# Patient Record
Sex: Female | Born: 1935 | Race: White | Hispanic: No
Health system: Southern US, Community
[De-identification: ages and names within clinical notes are randomized; demographics above are authoritative.]

---

## 2004-08-24 ENCOUNTER — Ambulatory Visit: Payer: Self-pay | Admitting: Family Medicine

## 2004-09-05 ENCOUNTER — Ambulatory Visit: Payer: Self-pay | Admitting: Family Medicine

## 2006-09-23 ENCOUNTER — Ambulatory Visit: Payer: Self-pay | Admitting: Family Medicine

## 2007-01-07 ENCOUNTER — Ambulatory Visit: Payer: Self-pay | Admitting: Family Medicine

## 2007-06-05 ENCOUNTER — Ambulatory Visit: Payer: Self-pay | Admitting: Family Medicine

## 2007-08-19 ENCOUNTER — Ambulatory Visit: Payer: Self-pay | Admitting: Family Medicine

## 2009-03-09 ENCOUNTER — Ambulatory Visit: Payer: Self-pay | Admitting: Family Medicine

## 2009-03-11 ENCOUNTER — Emergency Department: Payer: Self-pay | Admitting: Emergency Medicine

## 2009-03-21 ENCOUNTER — Emergency Department: Payer: Self-pay | Admitting: Emergency Medicine

## 2009-03-25 ENCOUNTER — Emergency Department: Payer: Self-pay | Admitting: Emergency Medicine

## 2009-04-06 ENCOUNTER — Ambulatory Visit: Payer: Self-pay | Admitting: Orthopedic Surgery

## 2010-02-13 ENCOUNTER — Ambulatory Visit: Payer: Self-pay | Admitting: Family Medicine

## 2010-05-14 ENCOUNTER — Emergency Department: Payer: Self-pay | Admitting: Emergency Medicine

## 2010-05-22 ENCOUNTER — Emergency Department: Payer: Self-pay | Admitting: Internal Medicine

## 2010-07-06 ENCOUNTER — Emergency Department: Payer: Self-pay | Admitting: Emergency Medicine

## 2010-09-16 ENCOUNTER — Emergency Department: Payer: Self-pay | Admitting: Emergency Medicine

## 2012-03-21 ENCOUNTER — Emergency Department: Payer: Self-pay | Admitting: Emergency Medicine

## 2012-03-21 LAB — COMPREHENSIVE METABOLIC PANEL
Albumin: 3.4 g/dL (ref 3.4–5.0)
Alkaline Phosphatase: 114 U/L (ref 50–136)
Anion Gap: 6 — ABNORMAL LOW (ref 7–16)
BUN: 34 mg/dL — ABNORMAL HIGH (ref 7–18)
Bilirubin,Total: 1.1 mg/dL — ABNORMAL HIGH (ref 0.2–1.0)
Calcium, Total: 8.8 mg/dL (ref 8.5–10.1)
Chloride: 101 mmol/L (ref 98–107)
Creatinine: 1.51 mg/dL — ABNORMAL HIGH (ref 0.60–1.30)
EGFR (African American): 36 — ABNORMAL LOW
Glucose: 157 mg/dL — ABNORMAL HIGH (ref 65–99)
Osmolality: 283 (ref 275–301)
Potassium: 3.5 mmol/L (ref 3.5–5.1)
Sodium: 136 mmol/L (ref 136–145)
Total Protein: 7 g/dL (ref 6.4–8.2)

## 2012-03-21 LAB — CBC
HCT: 37.3 % (ref 35.0–47.0)
HGB: 12.6 g/dL (ref 12.0–16.0)
MCH: 27.2 pg (ref 26.0–34.0)
MCV: 81 fL (ref 80–100)
Platelet: 174 10*3/uL (ref 150–440)
RBC: 4.63 10*6/uL (ref 3.80–5.20)
WBC: 12.8 10*3/uL — ABNORMAL HIGH (ref 3.6–11.0)

## 2012-03-24 ENCOUNTER — Inpatient Hospital Stay: Payer: Self-pay | Admitting: Internal Medicine

## 2012-03-24 LAB — URINALYSIS, COMPLETE
Blood: NEGATIVE
Granular Cast: 1
Nitrite: NEGATIVE
Ph: 5 (ref 4.5–8.0)
Protein: 30
Specific Gravity: 1.018 (ref 1.003–1.030)

## 2012-03-24 LAB — COMPREHENSIVE METABOLIC PANEL
Albumin: 3.5 g/dL (ref 3.4–5.0)
Alkaline Phosphatase: 143 U/L — ABNORMAL HIGH (ref 50–136)
Anion Gap: 5 — ABNORMAL LOW (ref 7–16)
BUN: 33 mg/dL — ABNORMAL HIGH (ref 7–18)
Bilirubin,Total: 1.3 mg/dL — ABNORMAL HIGH (ref 0.2–1.0)
Calcium, Total: 9.4 mg/dL (ref 8.5–10.1)
Chloride: 97 mmol/L — ABNORMAL LOW (ref 98–107)
Co2: 32 mmol/L (ref 21–32)
Creatinine: 1.67 mg/dL — ABNORMAL HIGH (ref 0.60–1.30)
Potassium: 3.4 mmol/L — ABNORMAL LOW (ref 3.5–5.1)
SGOT(AST): 37 U/L (ref 15–37)
SGPT (ALT): 35 U/L (ref 12–78)
Sodium: 134 mmol/L — ABNORMAL LOW (ref 136–145)
Total Protein: 7.7 g/dL (ref 6.4–8.2)

## 2012-03-24 LAB — CBC
HGB: 12.2 g/dL (ref 12.0–16.0)
MCHC: 33.4 g/dL (ref 32.0–36.0)
Platelet: 217 10*3/uL (ref 150–440)
RDW: 15.3 % — ABNORMAL HIGH (ref 11.5–14.5)
WBC: 13.1 10*3/uL — ABNORMAL HIGH (ref 3.6–11.0)

## 2012-03-24 LAB — PROTIME-INR
INR: 1.1
Prothrombin Time: 14.2 secs (ref 11.5–14.7)

## 2012-03-24 LAB — TSH: Thyroid Stimulating Horm: 1.69 u[IU]/mL

## 2012-03-24 LAB — MAGNESIUM: Magnesium: 1.8 mg/dL

## 2012-03-24 LAB — TROPONIN I: Troponin-I: <0.02 ng/mL

## 2012-03-25 LAB — BASIC METABOLIC PANEL
Anion Gap: 10 (ref 7–16)
BUN: 23 mg/dL — ABNORMAL HIGH (ref 7–18)
Chloride: 101 mmol/L (ref 98–107)
Creatinine: 1.48 mg/dL — ABNORMAL HIGH (ref 0.60–1.30)
EGFR (African American): 37 — ABNORMAL LOW
Glucose: 98 mg/dL (ref 65–99)
Osmolality: 276 (ref 275–301)
Sodium: 136 mmol/L (ref 136–145)

## 2012-03-25 LAB — CBC WITH DIFFERENTIAL/PLATELET
Basophil #: 0 10*3/uL (ref 0.0–0.1)
Basophil %: 0.2 %
Eosinophil %: 0.7 %
HCT: 31.9 % — ABNORMAL LOW (ref 35.0–47.0)
Lymphocyte #: 0.5 10*3/uL — ABNORMAL LOW (ref 1.0–3.6)
Lymphocyte %: 4.6 %
MCV: 80 fL (ref 80–100)
Monocyte %: 8.7 %
Neutrophil #: 9.6 10*3/uL — ABNORMAL HIGH (ref 1.4–6.5)
Platelet: 186 10*3/uL (ref 150–440)
RDW: 15.4 % — ABNORMAL HIGH (ref 11.5–14.5)
WBC: 11.2 10*3/uL — ABNORMAL HIGH (ref 3.6–11.0)

## 2012-03-26 LAB — BASIC METABOLIC PANEL
BUN: 16 mg/dL (ref 7–18)
Calcium, Total: 8.6 mg/dL (ref 8.5–10.1)
Chloride: 104 mmol/L (ref 98–107)
Creatinine: 1.32 mg/dL — ABNORMAL HIGH (ref 0.60–1.30)
EGFR (African American): 43 — ABNORMAL LOW
EGFR (Non-African Amer.): 37 — ABNORMAL LOW
Glucose: 92 mg/dL (ref 65–99)
Sodium: 138 mmol/L (ref 136–145)

## 2012-03-26 LAB — CBC WITH DIFFERENTIAL/PLATELET
Basophil %: 0.2 %
Eosinophil #: 0.1 10*3/uL (ref 0.0–0.7)
HCT: 33.2 % — ABNORMAL LOW (ref 35.0–47.0)
HGB: 11.2 g/dL — ABNORMAL LOW (ref 12.0–16.0)
Lymphocyte %: 8.6 %
MCHC: 33.6 g/dL (ref 32.0–36.0)
Monocyte %: 9.6 %
Neutrophil #: 6.5 10*3/uL (ref 1.4–6.5)
Neutrophil %: 79.9 %
RBC: 4.14 10*6/uL (ref 3.80–5.20)
WBC: 8.2 10*3/uL (ref 3.6–11.0)

## 2012-03-26 LAB — URINE CULTURE

## 2012-03-27 LAB — BASIC METABOLIC PANEL
BUN: 11 mg/dL (ref 7–18)
Co2: 20 mmol/L — ABNORMAL LOW (ref 21–32)
Creatinine: 1.2 mg/dL (ref 0.60–1.30)
EGFR (African American): 48 — ABNORMAL LOW
EGFR (Non-African Amer.): 41 — ABNORMAL LOW
Glucose: 88 mg/dL (ref 65–99)
Potassium: 3.9 mmol/L (ref 3.5–5.1)
Sodium: 140 mmol/L (ref 136–145)

## 2012-03-29 LAB — CULTURE, BLOOD (SINGLE)

## 2013-04-13 ENCOUNTER — Emergency Department: Payer: Self-pay | Admitting: Emergency Medicine

## 2013-04-13 LAB — URINALYSIS, COMPLETE
Bacteria: NONE SEEN
Blood: NEGATIVE
Leukocyte Esterase: NEGATIVE
Ph: 6 (ref 4.5–8.0)
Protein: 30
RBC,UR: 3 /HPF (ref 0–5)
Squamous Epithelial: NONE SEEN
WBC UR: 1 /HPF (ref 0–5)

## 2013-04-13 LAB — COMPREHENSIVE METABOLIC PANEL
Anion Gap: 7 (ref 7–16)
BUN: 16 mg/dL (ref 7–18)
Bilirubin,Total: 1.2 mg/dL — ABNORMAL HIGH (ref 0.2–1.0)
Calcium, Total: 9.5 mg/dL (ref 8.5–10.1)
Chloride: 94 mmol/L — ABNORMAL LOW (ref 98–107)
Co2: 31 mmol/L (ref 21–32)
Glucose: 138 mg/dL — ABNORMAL HIGH (ref 65–99)
Osmolality: 268 (ref 275–301)
SGPT (ALT): 13 U/L (ref 12–78)
Sodium: 132 mmol/L — ABNORMAL LOW (ref 136–145)
Total Protein: 7 g/dL (ref 6.4–8.2)

## 2013-04-13 LAB — CBC
HCT: 40 % (ref 35.0–47.0)
HGB: 14 g/dL (ref 12.0–16.0)
Platelet: 218 10*3/uL (ref 150–440)
RBC: 5.04 10*6/uL (ref 3.80–5.20)
WBC: 11.6 10*3/uL — ABNORMAL HIGH (ref 3.6–11.0)

## 2013-04-13 LAB — TROPONIN I: Troponin-I: <0.02 ng/mL

## 2013-04-13 LAB — LIPASE, BLOOD: Lipase: 83 U/L (ref 73–393)

## 2013-04-14 ENCOUNTER — Inpatient Hospital Stay: Payer: Self-pay | Admitting: Student

## 2013-04-14 LAB — CBC WITH DIFFERENTIAL/PLATELET
Basophil #: 0.1 10*3/uL (ref 0.0–0.1)
Eosinophil %: 0.1 %
HGB: 13.3 g/dL (ref 12.0–16.0)
Lymphocyte #: 0.8 10*3/uL — ABNORMAL LOW (ref 1.0–3.6)
Neutrophil #: 19.8 10*3/uL — ABNORMAL HIGH (ref 1.4–6.5)
Platelet: 219 10*3/uL (ref 150–440)
WBC: 21.7 10*3/uL — ABNORMAL HIGH (ref 3.6–11.0)

## 2013-04-14 LAB — URINALYSIS, COMPLETE
Bacteria: NONE SEEN
Bilirubin,UR: NEGATIVE
Blood: NEGATIVE
Leukocyte Esterase: NEGATIVE
Nitrite: NEGATIVE
RBC,UR: 1 /HPF (ref 0–5)
Specific Gravity: 1.021 (ref 1.003–1.030)
Squamous Epithelial: NONE SEEN
WBC UR: <1 /HPF (ref 0–5)

## 2013-04-14 LAB — COMPREHENSIVE METABOLIC PANEL
Albumin: 3.2 g/dL — ABNORMAL LOW (ref 3.4–5.0)
Bilirubin,Total: 1.2 mg/dL — ABNORMAL HIGH (ref 0.2–1.0)
Calcium, Total: 8.8 mg/dL (ref 8.5–10.1)
Chloride: 98 mmol/L (ref 98–107)
Creatinine: 1.31 mg/dL — ABNORMAL HIGH (ref 0.60–1.30)
EGFR (African American): 43 — ABNORMAL LOW
EGFR (Non-African Amer.): 37 — ABNORMAL LOW
Glucose: 132 mg/dL — ABNORMAL HIGH (ref 65–99)
Osmolality: 278 (ref 275–301)
Potassium: 3.3 mmol/L — ABNORMAL LOW (ref 3.5–5.1)
SGOT(AST): 19 U/L (ref 15–37)
Sodium: 134 mmol/L — ABNORMAL LOW (ref 136–145)

## 2013-04-14 LAB — MAGNESIUM: Magnesium: 1.3 mg/dL — ABNORMAL LOW

## 2013-04-15 LAB — CBC WITH DIFFERENTIAL/PLATELET
Basophil #: 0 10*3/uL (ref 0.0–0.1)
Eosinophil #: 0 10*3/uL (ref 0.0–0.7)
Eosinophil %: 0.1 %
HGB: 11.7 g/dL — ABNORMAL LOW (ref 12.0–16.0)
MCV: 80 fL (ref 80–100)
Monocyte #: 1.1 x10 3/mm — ABNORMAL HIGH (ref 0.2–0.9)
Monocyte %: 9.1 %
Neutrophil %: 84.5 %
Platelet: 198 10*3/uL (ref 150–440)
RBC: 4.24 10*6/uL (ref 3.80–5.20)
RDW: 14.8 % — ABNORMAL HIGH (ref 11.5–14.5)
WBC: 11.9 10*3/uL — ABNORMAL HIGH (ref 3.6–11.0)

## 2013-04-16 LAB — CBC WITH DIFFERENTIAL/PLATELET
Basophil #: 0 10*3/uL (ref 0.0–0.1)
Basophil %: 0.3 %
Eosinophil %: 0.8 %
HCT: 33 % — ABNORMAL LOW (ref 35.0–47.0)
Lymphocyte #: 0.7 10*3/uL — ABNORMAL LOW (ref 1.0–3.6)
MCH: 28 pg (ref 26.0–34.0)
MCV: 80 fL (ref 80–100)
Neutrophil %: 77.6 %
Platelet: 201 10*3/uL (ref 150–440)
RDW: 14.3 % (ref 11.5–14.5)
WBC: 8.4 10*3/uL (ref 3.6–11.0)

## 2013-04-16 LAB — BASIC METABOLIC PANEL
Anion Gap: 7 (ref 7–16)
BUN: 31 mg/dL — ABNORMAL HIGH (ref 7–18)
Calcium, Total: 8.4 mg/dL — ABNORMAL LOW (ref 8.5–10.1)
Chloride: 102 mmol/L (ref 98–107)
EGFR (African American): 52 — ABNORMAL LOW
EGFR (Non-African Amer.): 45 — ABNORMAL LOW
Potassium: 3 mmol/L — ABNORMAL LOW (ref 3.5–5.1)
Sodium: 138 mmol/L (ref 136–145)

## 2013-04-17 LAB — BASIC METABOLIC PANEL
Anion Gap: 6 — ABNORMAL LOW (ref 7–16)
BUN: 26 mg/dL — ABNORMAL HIGH (ref 7–18)
Calcium, Total: 8.6 mg/dL (ref 8.5–10.1)
Creatinine: 1.02 mg/dL (ref 0.60–1.30)
EGFR (Non-African Amer.): 50 — ABNORMAL LOW
Glucose: 106 mg/dL — ABNORMAL HIGH (ref 65–99)
Osmolality: 283 (ref 275–301)
Potassium: 3.4 mmol/L — ABNORMAL LOW (ref 3.5–5.1)

## 2013-04-19 LAB — CULTURE, BLOOD (SINGLE)

## 2013-05-01 ENCOUNTER — Inpatient Hospital Stay: Payer: Self-pay | Admitting: Specialist

## 2013-05-01 LAB — COMPREHENSIVE METABOLIC PANEL
Albumin: 3.8 g/dL (ref 3.4–5.0)
Anion Gap: 5 — ABNORMAL LOW (ref 7–16)
BUN: 77 mg/dL — ABNORMAL HIGH (ref 7–18)
Bilirubin,Total: 1 mg/dL (ref 0.2–1.0)
Chloride: 106 mmol/L (ref 98–107)
Co2: 29 mmol/L (ref 21–32)
Creatinine: 2.54 mg/dL — ABNORMAL HIGH (ref 0.60–1.30)
EGFR (African American): 19 — ABNORMAL LOW
Glucose: 115 mg/dL — ABNORMAL HIGH (ref 65–99)
Potassium: 4.2 mmol/L (ref 3.5–5.1)
SGOT(AST): 18 U/L (ref 15–37)
SGPT (ALT): 19 U/L (ref 12–78)
Total Protein: 7.7 g/dL (ref 6.4–8.2)

## 2013-05-01 LAB — URINALYSIS, COMPLETE
Bacteria: NONE SEEN
Bilirubin,UR: NEGATIVE
Blood: NEGATIVE
Glucose,UR: NEGATIVE mg/dL (ref 0–75)
Hyaline Cast: 3
Ph: 8 (ref 4.5–8.0)
RBC,UR: 1 /HPF (ref 0–5)
WBC UR: 1 /HPF (ref 0–5)

## 2013-05-01 LAB — CK TOTAL AND CKMB (NOT AT ARMC)
CK, Total: 33 U/L (ref 21–215)
CK-MB: <0.5 ng/mL — ABNORMAL LOW (ref 0.5–3.6)

## 2013-05-01 LAB — CBC
HCT: 39.3 % (ref 35.0–47.0)
MCHC: 33.5 g/dL (ref 32.0–36.0)
MCV: 82 fL (ref 80–100)
Platelet: 309 10*3/uL (ref 150–440)
RBC: 4.8 10*6/uL (ref 3.80–5.20)
RDW: 14.7 % — ABNORMAL HIGH (ref 11.5–14.5)
WBC: 10 10*3/uL (ref 3.6–11.0)

## 2013-05-02 LAB — CBC WITH DIFFERENTIAL/PLATELET
Basophil #: 0.1 10*3/uL (ref 0.0–0.1)
Basophil %: 1 %
Eosinophil #: 0.3 10*3/uL (ref 0.0–0.7)
Eosinophil %: 4.9 %
HCT: 33.5 % — ABNORMAL LOW (ref 35.0–47.0)
HGB: 11.4 g/dL — ABNORMAL LOW (ref 12.0–16.0)
Lymphocyte %: 16.7 %
MCH: 27.5 pg (ref 26.0–34.0)
MCHC: 34.1 g/dL (ref 32.0–36.0)
Monocyte #: 0.6 x10 3/mm (ref 0.2–0.9)
Monocyte %: 9.3 %
Neutrophil #: 4.4 10*3/uL (ref 1.4–6.5)
Neutrophil %: 68.1 %
RBC: 4.16 10*6/uL (ref 3.80–5.20)
RDW: 14.3 % (ref 11.5–14.5)

## 2013-05-02 LAB — BASIC METABOLIC PANEL
Anion Gap: 6 — ABNORMAL LOW (ref 7–16)
BUN: 58 mg/dL — ABNORMAL HIGH (ref 7–18)
Chloride: 112 mmol/L — ABNORMAL HIGH (ref 98–107)
Co2: 27 mmol/L (ref 21–32)
Glucose: 94 mg/dL (ref 65–99)
Osmolality: 305 (ref 275–301)
Potassium: 3.3 mmol/L — ABNORMAL LOW (ref 3.5–5.1)
Sodium: 145 mmol/L (ref 136–145)

## 2013-05-03 LAB — BASIC METABOLIC PANEL
BUN: 39 mg/dL — ABNORMAL HIGH (ref 7–18)
Calcium, Total: 8.8 mg/dL (ref 8.5–10.1)
Chloride: 112 mmol/L — ABNORMAL HIGH (ref 98–107)
Co2: 27 mmol/L (ref 21–32)
Creatinine: 1.68 mg/dL — ABNORMAL HIGH (ref 0.60–1.30)
EGFR (African American): 32 — ABNORMAL LOW
EGFR (Non-African Amer.): 27 — ABNORMAL LOW
Osmolality: 296 (ref 275–301)
Potassium: 3.1 mmol/L — ABNORMAL LOW (ref 3.5–5.1)

## 2013-05-04 LAB — BASIC METABOLIC PANEL
BUN: 28 mg/dL — ABNORMAL HIGH (ref 7–18)
Chloride: 114 mmol/L — ABNORMAL HIGH (ref 98–107)
Co2: 24 mmol/L (ref 21–32)
Potassium: 3.6 mmol/L (ref 3.5–5.1)

## 2013-05-13 ENCOUNTER — Ambulatory Visit: Payer: Self-pay | Admitting: Internal Medicine

## 2013-05-13 ENCOUNTER — Emergency Department: Payer: Self-pay | Admitting: Internal Medicine

## 2013-05-14 ENCOUNTER — Inpatient Hospital Stay: Payer: Self-pay | Admitting: Internal Medicine

## 2013-05-14 LAB — PRO B NATRIURETIC PEPTIDE: B-Type Natriuretic Peptide: 4837 pg/mL — ABNORMAL HIGH (ref 0–450)

## 2013-05-14 LAB — COMPREHENSIVE METABOLIC PANEL
Alkaline Phosphatase: 107 U/L (ref 50–136)
Anion Gap: 16 (ref 7–16)
Chloride: 104 mmol/L (ref 98–107)
Glucose: 152 mg/dL — ABNORMAL HIGH (ref 65–99)
Osmolality: 289 (ref 275–301)
Potassium: 3.1 mmol/L — ABNORMAL LOW (ref 3.5–5.1)
SGOT(AST): 21 U/L (ref 15–37)
Total Protein: 7.2 g/dL (ref 6.4–8.2)

## 2013-05-14 LAB — CBC
HCT: 41 % (ref 35.0–47.0)
MCHC: 34 g/dL (ref 32.0–36.0)
MCV: 82 fL (ref 80–100)

## 2013-05-14 LAB — CK TOTAL AND CKMB (NOT AT ARMC): CK, Total: 44 U/L (ref 21–215)

## 2013-05-19 LAB — CULTURE, BLOOD (SINGLE)

## 2013-06-06 ENCOUNTER — Ambulatory Visit: Payer: Self-pay | Admitting: Internal Medicine

## 2013-06-06 DEATH — deceased

## 2014-11-23 NOTE — H&P (Signed)
PATIENT NAME:  Haley Miles, Haley Miles MR#:  [id number] DATE OF BIRTH:  March 03, 1926  DATE OF ADMISSION:  03/24/2012  PRIMARY CARE PHYSICIAN: Ulanda Edison, MD  ER REFERRING PHYSICIAN: Si Raider, MD  CHIEF COMPLAINT: Cough, disorientation, and weakness.   HISTORY OF PRESENT ILLNESS: History is obtained from the ER physician, nursing staff, and old records reviewed and chest x-ray image reviewed along with EKG being reviewed personally.   This is an 81 year old female patient who is a resident of an assisted living facility with vascular dementia, hypertension, and hypothyroidism who presented to the emergency room sent in after she has been feeling extremely weak over the past few days and has not been eating at all and has disorientation. The patient at baseline has dementia, but has been more sleepy and confused at the assisted-living facility and has been brought to the emergency room. Here her daughter is not available at this time for history. I called the daughter and left a voice mail at [phone], and her name is Rocco Pauls.  The patient does not have any concerns and mentions that she wants to rest and sleep, does not complain of any pain, nausea, vomiting, rash, or fever. She has been coughing in the emergency room, which has been nonproductive. A chest x-ray shows possible bilateral basal infiltrates versus atelectasis, T-max is 100.2 with elevated white count, and the patient is being admitted to the hospitalist service for acute encephalopathy with evidence of healthcare-acquired pneumonia and dehydration with acute renal failure over chronic kidney disease.   PAST MEDICAL HISTORY:  1. Hypothyroidism. 2. Hyperlipidemia. 3. Hypertension. 4. Vascular dementia. 5. Osteoporosis.    PAST SURGICAL HISTORY:  1. Thyroidectomy. 2. Partial hysterectomy. 3. Carpal tunnel release.   FAMILY HISTORY: Reviewed and unknown.   ALLERGIES: No known drug allergies.   SOCIAL HISTORY: The  patient does not smoke. No alcohol and no illicit drugs. She is a resident of an assisted living facility. The patient's code status is unknown at this time. She tends to ambulate on her own but does need assistance at times. She is on a regular diet at the assisted living facility.   REVIEW OF SYSTEMS: Not obtainable as the patient is confused and lethargic.   HOME MEDICATIONS:  1. Donepezil 10 mg oral once a day.  2. Hydrochlorothiazide/triamterene 25/37.5 mg 1 tablet oral once a day.  3. Levothyroxine 75 mcg oral once a day.  4. Meloxicam 7.5 mg oral once a day.  5. Remeron 15 mg 1/2 tablet oral once a day at bedtime.  6. Nystatin 100,000 units topically to the affected area twice a day.  7. MiraLax once a day as needed for constipation.  8. Ranitidine 150 mg oral two times a day.  9. Simvastatin 20 mg oral once a day.  10. Tylenol 325 mg 2 tablets orally every four hours as needed for fever or pain.   PHYSICAL EXAMINATION:   VITAL SIGNS: Temperature 100.2, pulse 78, respirations 16, blood pressure 134/79, breathing 18 per minute, and saturating 94% on room air.   GENERAL: Frail, elderly Caucasian female patient lying in bed, lethargic and drowsy.   PSYCHIATRIC: She wakes up on calling her name. Has a flat affect, poor judgment, and is not oriented to person, place, or time.   HEENT: Atraumatic, normocephalic. Oral mucosa dry and pink. External ears and nose normal. No pallor. No icterus. Pupils bilaterally equal and reactive to light.   NECK: Supple. No thyromegaly. No palpable lymph nodes. Trachea midline. No carotid  bruit or JVD. Has scars from prior thyroid surgery.   CARDIOVASCULAR: S1 and S2 regular rate and rhythm without any murmurs. Peripheral pulses 2+. No edema.   RESPIRATORY: Has bilateral basal crackles with good air entry. Not using any accessory muscles. Normal to percussion.   GASTROINTESTINAL: Soft abdomen, nontender. Bowel sounds present. No hepatosplenomegaly  palpable.   SKIN: Warm and dry. No petechiae, rash, or ulcers.   MUSCULOSKELETAL: Has crepitus in her knees. No joint swelling, redness, or effusion of the large joints. Normal muscle tone.   NEURO: Generalized decreased motor strength of 4/5 in all her extremities. Sensation to fine touch intact all over.  LABORATORY, DIAGNOSTIC AND RADIOLOGIC DATA: Glucose 103, BUN 33, creatinine 1.67, sodium 134, potassium 3.4, chloride 97, GFR 28, and AST and ALT are normal. Troponin less than 0.02. WBC 13.1 with hemoglobin 12.2 and platelets of 217.   Urinalysis shows no bacteria, 1 WBC.   EKG shows normal sinus rhythm with nonspecific ST-T wave changes.   Chest x-ray shows good aeration. No cardiomegaly. Has some mild basilar atelectasis versus infiltrate, more so on the right side.   ASSESSMENT AND PLAN:  1. Acute encephalopathy likely secondary from pneumonia. The patient will be admitted onto the medical floor and treated for pneumonia along with fall precautions.  2. Healthcare-acquired pneumonia. The patient does seem to have some basal infiltrates, more so on the right side. Her T-max was 100.2 with elevated white count. The patient does not have any acute respiratory failure. We will start her on vancomycin, Zosyn, and levofloxacin at this time along with blood cultures and sputum cultures. If her cultures are negative, the patient's antibiotic choice can be narrowed quickly. We will repeat chest x-ray in the morning, PA and lateral, for follow-up.  3. Acute renal failure/chronic kidney disease. The patient does have worsening creatinine at 1.6 and GFR less than 30. She does seem to have baseline chronic kidney, disease stage III. We will start her on IV fluids and follow.  4. Hypokalemia likely secondary from decreased intake. We will replace as needed.  5. Hypothyroidism. We will check a TSH as this could be part of her encephalopathy.  6. General debility and unable to walk. The patient has  generalized weakness secondary to the pneumonia and dehydration. We will consult physical therapy.  7. CODE STATUS. Unknown at this time. We will try to contact the patient's daughter again for her code status. 8. Deep vein thrombosis prophylaxis with heparin.   TIME SPENT: Time spent today on this case was 50 minutes with more than 50% time spent in coordination of care. ____________________________ Molinda BailiffSrikar R. Deron Poole, MD srs:slb D: 03/24/2012 13:46:59 ET T: 03/24/2012 13:59:29 ET JOB#: [id number]  cc: Wardell HeathSrikar R. Merla Sawka, MD, <Dictator> Lucillie GarfinkelEmma R. Mayford KnifeWilliams, MD Orie FishermanSRIKAR R Remi Lopata MD ELECTRONICALLY SIGNED 03/24/2012 15:05

## 2014-11-23 NOTE — Discharge Summary (Signed)
PATIENT NAME:  Haley Miles, Haley Miles MR#:  [id number] DATE OF BIRTH:  12-20-1925  DATE OF ADMISSION:  03/24/2012 DATE OF DISCHARGE:  03/28/2012  ADMITTING DIAGNOSIS: Cough, confusion, and weakness.   DISCHARGE DIAGNOSES:  1. Cough and confusion due to pneumonia, likely community-acquired pneumonia. The patient lives in an assisted living facility.  2. Acute encephalopathy likely due to infection, acute renal failure, now close to baseline.  3. Baseline dementia.  4. Acute renal failure due to dehydration, resolved with IV hydration.  5. Hypothyroidism.  6. Hyperlipidemia.  7. Hypertension.  8. Vascular dementia.  9. Osteoporosis.  10. Status post thyroidectomy.  11. Status post partial hysterectomy.  12. Status post carpal tunnel release.   PERTINENT LABORATORY AND EVALUATIONS: Admitting glucose 103, BUN 33, creatinine 1.67, sodium 134, potassium 3.4, chloride 97. AST and ALT were normal. Troponin less than 0.02. WBC count was 13.1, hemoglobin 12.2. Urinalysis was negative for nitrites, leukocyte. EKG showed normal sinus rhythm with nonspecific ST-T wave changes. Chest x-ray showed density in the right lung base posteriorly consistent with pneumonia.   CONSULTANTS: Speech evaluation   HOSPITAL COURSE: The patient is an 81 year old white female who currently resides in an assisted living facility, has vascular dementia, hypertension, and hypothyroidism. She was sent to the ED after she was feeling extremely weak and was having a cough. The patient was admitted for possible pneumonia. She was started on broad-spectrum antibiotics, initially was noted to have leukocytosis. With treatment with antibiotics her WBC count normalized.  The patient's antibiotic coverage was narrowed . She also was evaluated for aspiration. There was no evidence of aspiration. On presentation she was noted to be in acute renal failure. She was given IV fluids and her creatinine normalized. At this time, the patient is doing  better, still not eating much, and has had intermittent confusion as a result of her dementia, but overall is doing better.   DISCHARGE MEDICATIONS:  1. Donepezil 10, 1 tab p.o. daily.  2. Levothyroxine 75 mcg daily.  3. Mirtazapine 15 mg, half tab at bedtime. Hold if the patient is sedated. 4. Nystatin p.r.n. 5. Ovide  0.5% topical lotion, apply topically to dry scalp and hair area once. 6. MiraLAX 17 grams in 4 to 8 ounces  water daily as needed.  7. Simvastatin 20 mg at bedtime.  8. Tylenol 650 mg every four hours p.r.n.  9. Ranitidine 1 tab p.o. b.i.d.  10. Levaquin 500, 1 tab p.o. q. 24 hours to continue for six days. 11. Fluticasone nasal spray, one spray to each nostril daily.   DIET: Regular diet, consistency mechanical soft with thin liquids, well cut meats, moisturize with gravy. Aspiration precautions. Make sure patient is sitting fully upright.   ACTIVITY: As tolerated with assistance.  FOLLOWUP: Follow up in 1 to 2 weeks with primary M.D., Dr. Ulanda EdisonEmma Williams.  TIME SPENT: 35 minutes.  ____________________________ Lacie ScottsShreyang H. Allena KatzPatel, MD shp:bjt D: 03/28/2012 13:40:13 ET T: 03/28/2012 14:11:33 ET JOB#: [id number]  cc: Granvil Djordjevic H. Allena KatzPatel, MD, <Dictator> Lucillie GarfinkelEmma R. Mayford KnifeWilliams, MD Charise CarwinSHREYANG H Milanni Ayub MD ELECTRONICALLY SIGNED 03/28/2012 16:04

## 2014-11-26 NOTE — H&P (Signed)
PATIENT NAME:  Haley Miles, HARTS MR#:  [id number] DATE OF BIRTH:  August 30, 1925  DATE OF ADMISSION:  05/14/2013  REFERRING ER PHYSICIAN:  Dr. Lowella Fairy.  PRIMARY CARE PHYSICIAN:  Dr. Ulanda Edison, at the [facility].   CHIEF COMPLAINT: Shortness of breath.   HISTORY OF PRESENT ILLNESS: The patient is an 81 year old female who had 2 recent admissions in the hospital in the last month, discharged 10 days ago. Came in for severe respiratory distress. As per the family members were present in the room the patient is a nursing home resident and she has terminal dementia. She walks minimally with a walker over there. Yesterday she had a fall and she was brought to the Emergency Room, but she was sent back from the ER.   Tuesday morning, the nursing home staff found her in severe respiratory distress and had vomitus in her mouth. Her blood pressure was also running on the lower side, so they sent her to the Emergency Room. She was found being hypotensive, tachypneic, and hypoxic and so started on non-rebreather mask oxygen supplementation and broad-spectrum antibiotics, though x-ray looked possibly aspiration because of repeated hospital admissions, and hospitalist service was contacted for further management of this issue.   REVIEW OF SYSTEMS: Unable to get because of patient's dementia.   Past medical history obtained from old medical records and history obtained from family members and old records available in the system.   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   PAST SURGICAL HISTORY: Ulcer surgery in the past, thyroid surgery, hysterectomy, bilateral carpal tunnel surgeries.   SOCIAL HISTORY: Lives at [facility]. Quit smoking 50 years ago. No alcohol. No drug use. Used to sew cushions in the past   FAMILY HISTORY: Father died of MI, unknown age. Mother died of childbirth complications.    HOME MEDICATIONS:  1.  Amlodipine 2.5 mg oral tablet once a day.  2.  Donepezil 10 mg oral tablet once a day.  3.  Fluticasone nasal 50 mcg spray once a day.  4.  Haldol 0.5 mg oral tablet, take one-half tablet 2 times a day as needed for agitation.  5.  Hydrochlorothiazide and triamterene, 25/37.5 mg oral capsule once a day.  6.  Levothyroxine 75 mcg once a day for thyroid activity.  7.  Megestrol 40 mg per mL oral suspension, 20 mL orally 2 times a day.  8.  Mirtazapine 15 mg oral tablet, take 1/2 tablet orally once a day.  9.  Nystatin 100,000 units per gram topical cream, apply to topically affected area 2 times a day.  10.  Simvastatin 20 mg oral tablet once a day at bedtime for cholesterol.  11. Tylenol 650 mg oral tablet every 8 hours as needed for pain.   VITALS VIRAL IN ER: Temperature 97.7, pulse rate ranging from 140 to 120, and respiratory rate ranging from 35 to 25. Blood pressure was 108/61 on arrival, but has gone down to 82/52, and after receiving some IV fluids now it is 88/55. Pulse ox is currently 96% to 97% on non-rebreather mask.  GENERAL: The patient is alert but appears in respiratory distress. She is cachectic and not very well oriented to time, place and person.  HEENT: Head and neck atraumatic. Oral mucosa dry.  NECK: Supple. No JVD.  RESPIRATORY: Bilateral equal air entry. Some crepitation present. No wheezing.  CARDIOVASCULAR: S1, S2 present, regular, tachycardia.  ABDOMEN: Soft, nontender. Bowel sounds present. No organomegaly.  SKIN: No rashes.  LEGS: No edema, but chronic skin changes present.  NEUROLOGICAL: Power 3/5. Moves limbs very weakly. No tremors or rigidity.   IMPORTANT LAB RESULTS: Glucose 152. BNP is 4837, BUN is 51, creatinine 2.61, which was 1.02 on 12th of September 2014.   Sodium 136, potassium 3.1, chloride 104 and CO2 16. Calcium is 10.   Troponin is 0.12.   White cell count is 21,000, hemoglobin 13.9 and platelet count 315.   ABG:  PH 7.40, pCO2 21 and pO2 is 87 on 100% FiO2 with non-rebreather mask.   Chest x-ray, portable, shows right lung base pneumonia; follow-up PA and lateral view.   ASSESSMENT AND PLAN: An 81 year old female with a past medical history of dementia, hypertension, hyperlipidemia, who is a nursing home resident, with repeated admissions to the hospital, sent today because of acute respiratory failure and found having vomitus in the mouth at the same time.   1.  Aspiration pneumonitis: As she had multiple admissions and she is currently hypotensive, will also cover with broad-spectrum antibiotics, but may taper fast if she starts recovering.  2.  Acute respiratory failure: She is currently on a non-rebreather mask. Family need to decide about intubation and ventilator issues as she might need it if she does not  improve enough. Currently oxygen saturations are well-maintained on non-rebreather mask.  3.  Septic shock as evidenced by hypotension, hypoxia, tachycardia, tachypnea, renal failure and evidence of pneumonia on chest x-ray: We will admit her to CCU with broad-spectrum antibiotics. Will give IV fluids for now which is able to bring up the blood pressure a little bit.   I also discussed with the family about the possible need of Lopressor and central line, and they need to discuss this issue amongst themselves and come to a final conclusion if the need arises.  4.  Aspiration: Will get a speech and swallow evaluation. Meanwhile will keep her n.p.o. 5.  Elevated BNP and troponin: Most likely this should be because of acute stress and infection, but we will get echocardiogram and will follow serial troponins to make sure it is not a cardiac event,  6.  Other medical issues: Dementia, hypertension, hyperlipidemia, hypothyroidism: Will hold all these medications at this time as we do not want to give anything orally and the patient is hypotensive.   Condition is critical due to respiratory failure and  septic shock, and malnutrition. Discussed with family members about her deciding code status,  and also called palliative care consult to help family members in this decision-making.   Prognosis is extremely poor due to repeated admissions, malnutrition, and sick condition this time, and she might benefit from comfort care.   Total critical care time spent: 60 minutes.    ____________________________ Hope PigeonVaibhavkumar G. Elisabeth PigeonVachhani, MD vgv:dm D: 05/14/2013 10:44:48 ET T:1 05/14/2013 11:09:38 ET JOB#: [id number]  cc: Hope PigeonVaibhavkumar G. Elisabeth PigeonVachhani, MD, <Dictator> Altamese DillingVAIBHAVKUMAR Rexanne Inocencio MD ELECTRONICALLY SIGNED 05/18/2013 23:03

## 2014-11-26 NOTE — Discharge Summary (Signed)
PATIENT NAME:  Haley ArrowCURTIS, Peg MR#:  [id number] DATE OF BIRTH:  08-13-1925  DATE OF ADMISSION:  04/14/2013 DATE OF DISCHARGE:  04/20/2013  PRIMARY CARE PHYSICIAN: Dr. Ulanda EdisonEmma Williams.   CHIEF COMPLAINTS: Vomiting; altered mental status.   DISCHARGE DIAGNOSES: 1.  Metabolic encephalopathy, secondary to pneumonia in the setting of dementia.  2.  Dehydration.  3.  Hypertension.  4.  Chronic kidney disease, stage III.  5.  Hypokalemia.  6.  History of hypothyroidism.  7.  History of depression.  8.  Hyperlipidemia.  9.  Osteoporosis.   DISCHARGE MEDICATIONS: Donepezil 10 mg daily, levothyroxine 75 mcg daily, mirtazapine 7.5 mg at bedtime, nystatin topical apply 2 times a day to affected areas, fluticasone mcg, 1 spray once a day, hydrochlorothiazide/triamterene 25/37.5 mg 1 tablet once a day, simvastatin 20 mg daily, haloperidol 0.5 mg tablet, one-half tablet 2 times a day as needed for agitation, Tylenol 650 every 8 hours as needed for pain, amlodipine 2.5 mg daily, cefprozil  500 mg 1 tablet every 12 hours for 2 days.   DISPOSITION: Back to [facility] with home health PT and RN.   DIET: Low-sodium.   ACTIVITY: As tolerated.   FOLLOWUP: Please follow with PCP within 1 to 2 weeks.   THE PATIENT IS A FULL CODE.   HISTORY OF PRESENT ILLNESS AND HOSPITAL COURSE: For full details of H and P, please see the dictation on September 9th by Dr. Elisabeth PigeonVachhani, but briefly this is an 81 year old female with a history of vascular dementia at baseline who came in for the above chief complaint. She was lethargic, had some vomiting. CT of the abdomen did not show any worsening from the previous CT which was done in the ER, however some pneumonia was seen at the bases and therefore she was admitted to the hospitalist service.   She was more confused than her base vomiting. This is likely metabolic encephalopathy  secondary to pneumonia. She was started on some gentle fluids and antibiotics with ceftriaxone,  azithromycin and nebs for treatment. The patient did have some cough hoarseness and a sore throat, likely secondary to a vomiting episode she had had recently, which has resolved. She did well and slowly improved with the antibiotics.   On arrival she did have a bump in BUN and creatinine above her baseline. The patient does appear to have CKD, stage III, and her BUN on arrival was 36 and creatinine of 1.31. By September 12th\, creatinine was 1.02.   The patient did have significant leukocytosis with a WBC of 21.7 on admission, which had resolved by September 11th, being 8.4. Blood cultures were negative.   Lactic acid on arrival was 1.4.   X-ray of the chest, PA and lateral, shows mild chronic enlargement of the cardiac silhouette, but no overt evidence of CHF or pneumonia, but the CT chest without contrast on done on September 9th had shown atelectasis versus infiltrate within the lung bases as well as trace effusion. Given that as well as the significant leukocytosis she likely did have pneumonia. She has improved significantly, and antibiotics were transitioned to p.o. cephalosporins and her appetite is better, but not at baseline. The patient was seen by physical therapy, and although the recommendation was for SNF the family and her previous place of residence thought that she could go back to the same place with home health and PT. She is a chronic resident of [facility] and she might be best-served there. On the day of discharge her temperature was 98.2,  pulse rate 85, respiratory rate 16, blood pressure 141/72. O2 sat 95%.   GENERAL: The patient is a frail, elderly Caucasian female laying in bed, arousable, not acutely distressed. The patient has normal respiratory effort. No use of accessory muscles and still has some mild crackles but improved on the right,  Regular rate and rhythm on auscultation of the heart sounds, and no significant lower extremity edema. She will be discharged with  outpatient follow with PCP.   Total time spent: Thirty-five minutes.     ____________________________ Krystal Eaton, MD sa:dm D: 04/21/2013 08:25:06 ET T: 04/21/2013 09:10:30 ET JOB#: [id number]  cc: Krystal Eaton, MD, <Dictator> Krystal Eaton MD ELECTRONICALLY SIGNED 04/28/2013 14:06

## 2014-11-26 NOTE — H&P (Signed)
PATIENT NAME:  Haley Miles, TARPLEY MR#:  [id number] DATE OF BIRTH:  September 29, 1925  DATE OF ADMISSION:  05/01/2013  PRIMARY CARE PHYSICIAN: Dr. Ulanda Edison at the [facility].   CHIEF COMPLAINT: Not feeling good.   HISTORY OF PRESENT ILLNESS: This is an 81 year old female who was recently in the hospital and discharged mid September and came in for metabolic encephalopathy secondary to pneumonia and also dehydration. As per the daughter, the patient has not been eating or drinking very well over the last 2 weeks. She is walking with a walker with the physical therapist at [facility] assisted [facility]. The patient does have dementia, so she is not the best historian. She does answer some yes or no questions but does not elaborate. In the ER, she was found to be in acute renal failure with a creatinine of 2.54. Her baseline creatinine was 1.02, which makes her chronic kidney disease stage III. Hospitalist services were contacted for further evaluation.   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   PAST SURGICAL HISTORY: Ulcer surgery in the past, thyroid surgery, hysterectomy, bilateral carpal tunnel.   ALLERGIES: No known drug allergies.   MEDICATIONS: As per Prescription Writer include amlodipine 2.5 mg (which was just stopped as per her primary care physician), donepezil 10 mg at bedtime, Flonase 50 mcg per spray 1 spray each nostril daily, Haldol 0.5 mg twice a day as needed for agitation, Dyazide 25/37.5 one tablet daily for blood pressure, levothyroxine 75 mcg daily, Remeron 15 mg 1/2 tablet at night, nystatin cream under breasts twice a day, simvastatin 20 mg daily, Tylenol extended-release 1 tablet every 8 hours as needed for pain.   SOCIAL HISTORY: Lives at [facility] assisted living. Quit smoking 50 years ago. No alcohol. No drug use. Used to sew cushions in the past.   FAMILY HISTORY: Father died of MI,  unknown age. Mother died of childbirth complications   REVIEW OF SYSTEMS:    CONSTITUTIONAL: No fever, chills or sweats. Positive for weight loss, cannot tell me how much. Positive for weakness.  EYES: She does wear glasses.  EARS, NOSE, MOUTH AND THROAT: No sore throat. No difficulty swallowing.  CARDIOVASCULAR: No chest pain. No palpitations.  RESPIRATORY: Occasional cough. No shortness of breath. No sputum. No hemoptysis.  GASTROINTESTINAL: No nausea. No vomiting. No abdominal pain. No diarrhea. No constipation. No bright red blood per rectum. No melena.  GENITOURINARY: No burning on urination. No hematuria.  MUSCULOSKELETAL: No joint pain or muscle pain.  INTEGUMENTARY: Skin rash under the breast.  NEUROLOGIC: No fainting or blackouts.  PSYCHIATRIC: On medication for dementia and agitation.  ENDOCRINE: Positive for hypothyroidism.  HEMATOLOGIC AND LYMPHATIC: No anemia.   PHYSICAL EXAMINATION: VITAL SIGNS: Temperature 98.4, pulse 75, respirations 20, blood pressure 141/71 and pulse oximetry 98% on room air.  GENERAL: No respiratory distress.  EYES: Conjunctivae and lids normal. Pupils equal, round and reactive to light. Extraocular muscles intact. No nystagmus.  EARS, NOSE, MOUTH AND THROAT: Nasal mucosa: No erythema. Throat: No erythema, no exudate seen. Lips and gums: No lesions.  NECK: No JVD. No bruits. No lymphadenopathy. No thyromegaly. No thyroid nodules palpated.  LUNGS: Clear to auscultation. No use of accessory muscles to breathe. No rhonchi, rales or wheeze heard.  CARDIOVASCULAR: S1, S2 normal. No gallops, rubs or murmurs heard. Carotid upstroke 2+ bilaterally, no bruits. Dorsalis pedis pulses 1+ bilaterally. Trace edema of the lower extremity.  ABDOMEN: Soft, nontender. No organomegaly/splenomegaly. Normoactive bowel sounds. No  masses felt.  LYMPHATIC: No lymph nodes in the neck.  MUSCULOSKELETAL: Trace edema. No clubbing. No cyanosis.  SKIN: No ulcers or lesions. Skin  tenting.  NEUROLOGIC: The patient is able to move extremities.  PSYCHIATRIC: The patient is alert. Answers yes or no questions but cannot elaborate secondary to dementia.   LABORATORY AND RADIOLOGICAL DATA: Chest x-ray negative. BNP 436. White blood cell count 10.0, hemoglobin and hematocrit 13.2 and 39.3, platelet count of 309. Glucose 115, BUN 77, creatinine 2.54, sodium 140, potassium 4.2, chloride 106, CO2 of 29, calcium 10.4. Liver function tests: Normal range. GFR down to 17. Troponin negative. Urinalysis negative.   EKG: Normal sinus rhythm, 75 beats per minute.   ASSESSMENT AND PLAN: 1.  Acute renal failure on chronic kidney disease stage III with dehydration and poor appetite. Will give IV fluid hydration and hold Dyazide at this point in time. Hopefully with IV fluid hydration and improving creatinine, the appetite will improve.  2.  Hypertension: Will hold the Dyazide at this time. Will continue the Norvasc low dose that was recently started.  3.  Hyperlipidemia: Continue Zocor.  4.  Dementia: The patient is on Aricept, Haldol for agitation and Remeron for sleep.  5.  Fungal infection on the breast: Will switch the nystatin cream over to powder.   TIME SPENT ON ADMISSION: 55 minutes.   CODE STATUS: The patient is a full code.    ____________________________ Herschell Dimesichard J. Renae GlossWieting, MD rjw:jm D: 05/01/2013 13:44:08 ET T: 05/01/2013 14:01:48 ET JOB#: [id number]  cc: Herschell Dimesichard J. Renae GlossWieting, MD, <Dictator> Lucillie GarfinkelEmma R. Mayford KnifeWilliams, MD Salley ScarletICHARD J Sanita Estrada MD ELECTRONICALLY SIGNED 05/09/2013 13:46

## 2014-11-26 NOTE — Discharge Summary (Signed)
PATIENT NAME:  Haley Miles, Haley Miles MR#:  [id number] DATE OF BIRTH:  [birth year]  DATE OF ADMISSION:  05/01/2013 DATE OF DISCHARGE:  05/04/2013  For a detailed note, please take a look at the history and physical done on admission by Dr. Alford Highlandichard Wieting.   DISCHARGE DIAGNOSIS: 1.  Acute renal failure likely secondary to poor p.o. intake and failure to thrive.  2.  Hypertension.  3.  Dementia.  4.  Adult failure to thrive secondary to underlying dementia.  5.  Hypokalemia.  6.  Hypothyroidism.   DIET: The patient is being discharged on a low sodium, low fat diet. The patient is also to take Ensure supplements 3 times daily with meals.   ACTIVITY: As tolerated.   DISCHARGE FOLLOWUP: With Dr. Ulanda EdisonEmma Williams from [facility].   DISCHARGE MEDICATIONS: 1.  Aricept 10 mg at bedtime. 2.  Synthroid 75 mcg daily. 3.  Remeron 15 mg 1/2 tab at bedtime. 4.  Nystatin topically to the groin b.i.d. 5.  Flonase 1 spray to nostril daily. 6.  Simvastatin 20 mg daily. 7.  Haldol 0.5 mg 1/2 tab b.i.d. as needed. 8.  Tylenol 650 mg q. 8 hours as needed.  9.  Norvasc 2.5 mg daily.  10.  Megace 400 mg b.i.d.  PERTINENT STUDIES: Done during the hospital course are as follows: A chest x-ray done on admission showing no acute cardiopulmonary disease.   HOSPITAL COURSE: This is an 81 year old female with multiple medical problems as mentioned above who presented to the hospital due to lethargy, weakness, and poor p.o. intake and noted to be in acute renal failure.  1.  Acute renal failure. The patient presented to the hospital with a creatinine as high as 2.5; her baseline creatinine is around 1.0. This was likely secondary to her poor p.o. intake and failure to thrive plus her taking diuretics. The patient was on HCTZ and triamterene, which was discontinued. She was hydrated with IV fluids. Her creatinine since admission has significantly improved and come to as low as 1.4. Her p.o. intake still remains  fair, but not the greatest, At this point, she is taking liquids fairly well and therefore is being discharged home. She is encouraged to take p.o. more and will be started on appetite stimulants with Megace.  2.  Hypertension. The patient remained stable on her Norvasc. Her triamterene and HCTZ were discontinued as it was probably contributing to her renal failure. At this point, her blood pressure has remained well stable on Norvasc. She will just continue that.  3.  Hyperlipidemia. The patient was maintained on her simvastatin. She will resume that. 4.  Dementia with failure to thrive. The patient will continue on Aricept and Remeron. I am adding some Megace as an appetite stimulant.  5.  Hypothyroidism. Continue Synthroid. The patient was maintained on that.  6.  Hypokalemia. This has since then been supplemented and improved.   The patient presently is being discharged back to her assisted living at [facility] with physical therapy services. The plan was discussed with the patient's daughter and she is in agreement.   TIME SPENT WITH DISCHARGE: 40 minutes. ____________________________ Rolly PancakeVivek J. Cherlynn KaiserSainani, MD vjs:sb D: 05/04/2013 15:30:32 ET T: 05/04/2013 16:31:10 ET JOB#: [id number]  cc: Rolly PancakeVivek J. Cherlynn KaiserSainani, MD, <Dictator> Lucillie GarfinkelEmma R. Mayford KnifeWilliams, MD Houston SirenVIVEK J Muaad Boehning MD ELECTRONICALLY SIGNED 05/13/2013 19:14

## 2014-11-26 NOTE — Discharge Summary (Signed)
PATIENT NAME:  Haley ArrowCURTIS, Luverne MR#:  [id number] DATE OF BIRTH:  05/28/1926  HISTORY OF PRESENT ILLNESS: This patient was admitted to critical care unit and with the help of palliative care team, family members decided to place her in hospice for comfort care, so the patient is discharged to comfort care to hospice.   HOSPICE HOME ADMITTING DIAGNOSIS:  Dementia, recurrent pneumonia, failure to thrive.   MEDICATIONS ADVISED ON HOSPICE ADMISSION:  1.  Morphine 0.25 mL to 0.5 mL orally one to 2 hours as needed for pain and dyspnea.  2.  Lorazepam 0.5 mg oral tablet one to 2 tablets sublingual every two to 4 hours as needed for agitation and anxiety.  3.  Promethazine 12.5 mg oral tablet every 6 hours as needed for nausea and vomiting.   HISTORY OF PRESENT ILLNESS: Please see H and P done me today. An 81 year old female with 2 recent admissions in the last 1 month for aspirations and pneumonia and failure-to-thrive  issues. The patient was discharged a week ago from hospital, sent to the nursing home. She was fine till yesterday, and on the day of admission in the morning, she was found having severe respiratory distress and had vomitus in the mouth, so brought to the Emergency Room.   She was found having tachypnea, hypotension, tachycardia and hypoxia, so she was started on nonrebreather mask and IV fluids were started to resuscitate her as well as started on broad-spectrum antibiotic because of recent hospitalization history and aspiration.   Discussion with the family was done about her further management, and they were waiting for some of the members to come in, so meanwhile, the patient was admitted to critical care unit. After all the family members were here, we had a meeting with palliative care team, and family came to the conclusion of making her comfortable. Initially they were willing to continue all of the measures but to keep her DNR, but later on they finally decided to sign for hospice  home, and the patient was transferred to hospice home for comfort care.   CONSULT IN THE HOSPITAL STAY: Palliative care with Dr. Harvie JuniorPhifer.   IMPORTANT LABORATORY RESULTS: BNP was 4837. BUN was 51, creatinine 2.61, potassium 3.1. CO2 was 16. White cell count was 21,000. Hemoglobin 13.9, platelet count 315 and MCV 82. On ABG, pH was 7.40, pCO2 was 21, and pO2 87.   X-ray, chest, showed right lung base pneumonia.   TOTAL TIME SPENT ON THIS DISCHARGE: 35 minutes.    ____________________________ Hope PigeonVaibhavkumar G. Elisabeth PigeonVachhani, MD vgv:np D: 05/14/2013 15:56:59 ET T: 05/14/2013 21:30:51 ET JOB#: [id number]  cc: Hope PigeonVaibhavkumar G. Elisabeth PigeonVachhani, MD, <Dictator> Altamese DillingVAIBHAVKUMAR Tramel Westbrook MD ELECTRONICALLY SIGNED 05/18/2013 23:03

## 2014-11-26 NOTE — Consult Note (Signed)
   Comments   Went back to ER to follow up with family but patient now in CCU. Met with family again. Family has made a decision to transfer patient to the [facility] for end of life care. They want to focus on her comfort. They do not want to start pressors but would be ok with continuation of I abx until patient can transfer to Hospice. I updated Dr Anselm Jungling. I also notified Ginny Ward, liaison for hospice who will speak with family. Orders entered.   Electronic Signatures: Borders, Kirt Boys (NP)  (Signed 09-Oct-14 13:08)  Authored: Palliative Care   Last Updated: 09-Oct-14 13:08 by Irean Hong (NP)

## 2014-11-26 NOTE — H&P (Signed)
PATIENT NAME:  Haley Miles, FEEHAN MR#:  [id number] DATE OF BIRTH:  [birth year]  DATE OF ADMISSION:  04/14/2013  PRIMARY CARE PHYSICIAN: Dr. Ulanda Edison ER REFERRING PHYSICIAN:  Dr. Sharman Cheek   CHIEF COMPLAINT: Vomiting, altered mental status.   HISTORY OF PRESENTING ILLNESS: The patient is an 81 year old female who lives in an assisted living facility and is having some baseline dementia and needs walker some support to walk, able to eat on her own and usually alert and mildly disoriented due to her dementia, but fairly stable health for the last 1 year. Last time admitted was August 2013 for pneumonia. Since then she was doing fine until yesterday.  Yesterday morning she started having vomiting, and she was more lethargic, and so the family brought her to the Emergency Room. CT of the abdomen was done as well as an x-ray of the abdomen and other lab work.  They were all  noncontributory for the finding of her vomiting and altered mental status, and so she was sent back to assisted living facility; but she continued to remain the same and did not eat anything almost last 48 hours, so they brought her back today with the same complaints. CT of the abdomen is repeated again today, did not show any worsening today, and so she was given admission for her vomiting and altered mental status. On further questioning to the patient's son and daughter who are present in the room, they say that the patient was exactly like this 1 year ago when she had pneumonia. They denied any complaint of fever or chills, but they confirmed that she has some cough and some coarse sounds in her throat or chest of having mucus or secretions.   REVIEW OF SYSTEMS: Unable to get as the patient is slightly drowsy. History obtained from the patient's son and daughter present in the room and from previous medical records.   PAST MEDICAL HISTORY: 1.  Hypothyroidism.  2.  Hyperlipidemia.  3.  Hypertension.  4.  Vascular dementia.   5.  Osteoporosis.   PAST SURGICAL HISTORY:  1.  Thyroidectomy.  2.  Partial hysterectomy.  3.  Carpal tunnel release.   FAMILY HISTORY: Brother had some cancer, and he died of coronary artery disease.  Another brother had liver cancer, and her sister had lung cancer.   SOCIAL HISTORY: She lives in an assisted living facility. She used to be a smoker but quit 50 years ago. She does not drink alcohol or no drug use. She ambulates on her own using a walker or some type of support, and she is able to feed herself on her own except the last 2 days when she has vomiting and altered mental status, has some baseline dementia.   HOME MEDICATIONS:  1.  Tylenol 650 mg every eight hours.  2.  Simvastatin 20 mg oral once a day at bedtime.  3.  Nystatin cream topically affected area 2 times a day.  4.  Mirtazapine 15 mg oral tablet, take 1/2 tablet once a day.  5.  Levothyroxine 75 mcg once a day.  6.  Hydrochlorothiazide/triamterene 25/37.5 mg oral tablet once a day.  7.  Haldol 0.5 mg oral tablet 2 times a day.  8.  Flagyl 500 mg oral tablet 2 times a day.  9.  Donepezil 10 mg oral once a day.  10.  Ciprofloxacin 500 mg oral every 12 hours.   PHYSICAL EXAMINATION: VITAL SIGNS: In the ER, temperature 98.4, pulse rate 95, respirations 18,  blood pressure 144/64 and oxygen saturation 93 to 95% on room air.  GENERAL: The patient is slightly drowsy but arousable. She is confused in her baseline dementia, but she is cooperative with history-taking and physical examination.  HEAD, EYES, EARS, NOSE, AND THROAT: Head and neck atraumatic. Conjunctivae pink. Oral mucosa dry.  NECK: Supple. No JVD.  RESPIRATORY: Bilateral equal air entry. Few crepitations heard.  CARDIOVASCULAR: S1, S2 present, regular. No murmur.  ABDOMEN: Soft, nontender. Bowel sounds normal. No organomegaly.  SKIN: No rashes.  LEGS: No edema.  NEUROLOGICAL: Power 4 out of 5 all 4 limbs. Follows commands. No tremors or rigidity. JOINTS:  No swelling or tenderness.   IMPORTANT LAB RESULTS: Glucose 132, BUN 36, creatinine 1.31. BUN was 16 yesterday. Potassium was 3.1 yesterday, today is 3.3. CO2 is 31. Lipase is 47. White cell count was 11,600 yesterday, today is 21,700, hemoglobin 14, platelets 219 and MCV 80. Urinalysis is grossly negative yesterday and today.  Lactic acid is 1.4.   RADIOLOGICAL REPORTS: Abdominal x-ray done yesterday shows nonobstructive bowel gas pattern with a small-to-moderate amount of stool. CT of the abdomen and pelvis yesterday  showed distended gallbladder.  Stomach is moderately distended.  Small and large bowel gas. Stool burden are within limits of normal. Extensive colonic diverticulosis. No evidence of intra-abdominal or pelvic abscess.  Bibasilar atelectasis and bronchiectasis, tiny pleural effusion posteriorly. Chest x-ray, PA and lateral today, mild chronic enlargement of cardiac silhouette.  No CHF or pneumonia. CT of the abdomen today: Diffuse diverticulosis, mild decompression of gallbladder, otherwise no change from yesterday.   ASSESSMENT AND PLAN: An 81 year old female with past medical history of hyperlipidemia, hypertension and vascular dementia who lives at assisted living facility and fairly able to do her day-to-day activities and walks with walker.  For the last 2 days, she is more lethargic and vomiting. CT scan of the abdomen is negative or not contributory for the cause of the sickness, but yesterday CT of the abdomen showed lower bibasilar increased atelectasis with pleural effusion, and white cell count has gone up since yesterday, was 11,000, today is 21,000.  We will admit her.   1.  Pneumonia:  We will get CT of the chest to confirm as x-ray of the chest does not show any clear-cut findings, but CT of the abdomen was showing bibasilar atelectasis versus increased density.  She was started on Rocephin plus Zithromax IV, and we will try to get sputum culture.  2.  Altered mental status:  This is most likely due to pneumonia, will treat underlying cause and  hold, however, any type of sedative medication.  3.  Hypokalemia: We will replace IV and check magnesium level.  4.  Chronic renal failure, which appears almost stable but some worsening of BUN: We will keep on monitoring her renal function.  5.  Hyperlipidemia: We will continue statin.  6.  Hypertension: Currently, as she is not eating enough, and she is vomiting and has some infection, we will hold her blood pressure medication.  7.  Hypothyroidism: We will continue her levothyroxine which she was taking before.   CODE STATUS: FULL CODE. Healthcare power of attorney is son and daughter, who are present in the room.    TOTAL TIME SPENT IN THIS ADMISSION: 50 minutes.    ____________________________ Hope PigeonVaibhavkumar G. Elisabeth PigeonVachhani, MD vgv:cb D: 04/14/2013 16:53:32 ET T: 04/14/2013 17:28:14 ET JOB#: [id number]  cc: Hope PigeonVaibhavkumar G. Elisabeth PigeonVachhani, MD, <Dictator> Altamese DillingVAIBHAVKUMAR Rishaan Gunner MD ELECTRONICALLY SIGNED 05/01/2013 18:32

## 2015-05-09 IMAGING — CR DG ABDOMEN 2V
1 series · 2 of 2 positions shown · non-contrast
Comparison: none

REASON FOR EXAM: vomiting
COMMENTS:

PROCEDURE:     DXR - DXR ABDOMEN 2 V FLAT AND ERECT  - April 13, 2013 [DATE]
RESULT:

[Series 1: erect ap · 0.17mm/px · 2 of 2 slices shown]
[im 1/2]
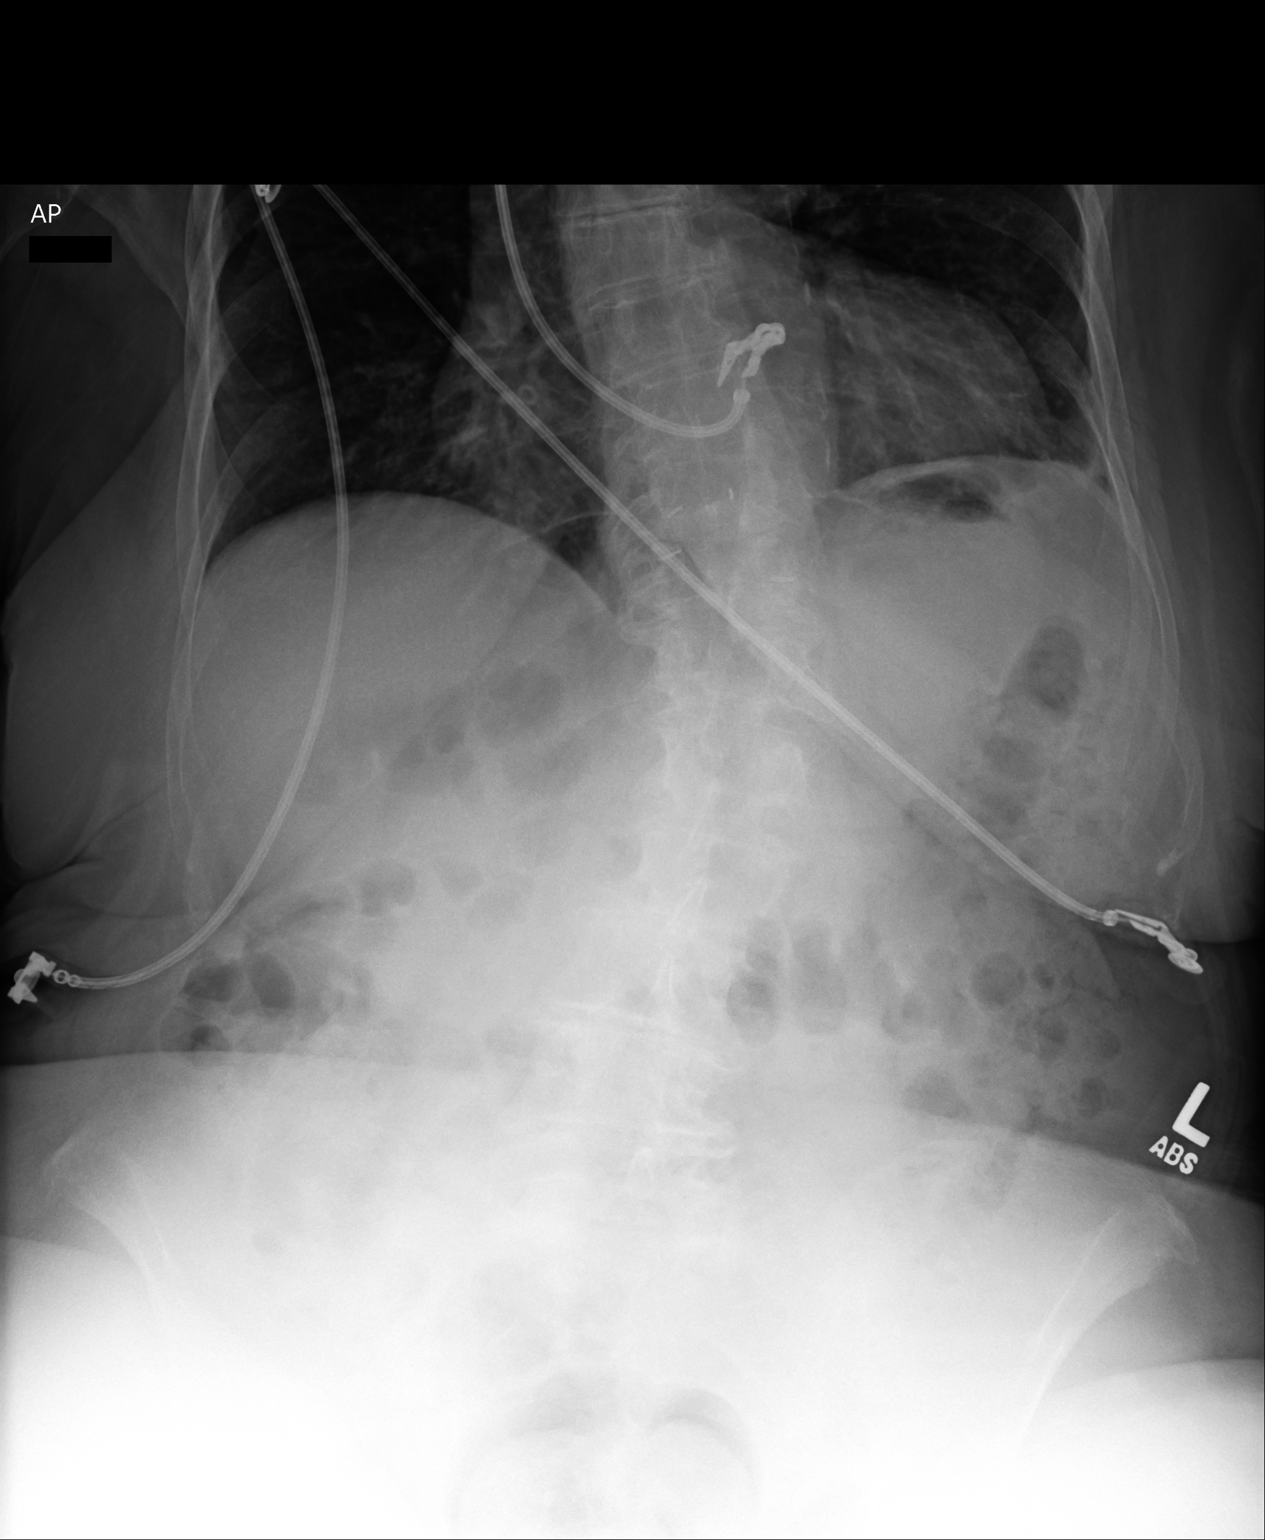
[im 2/2]
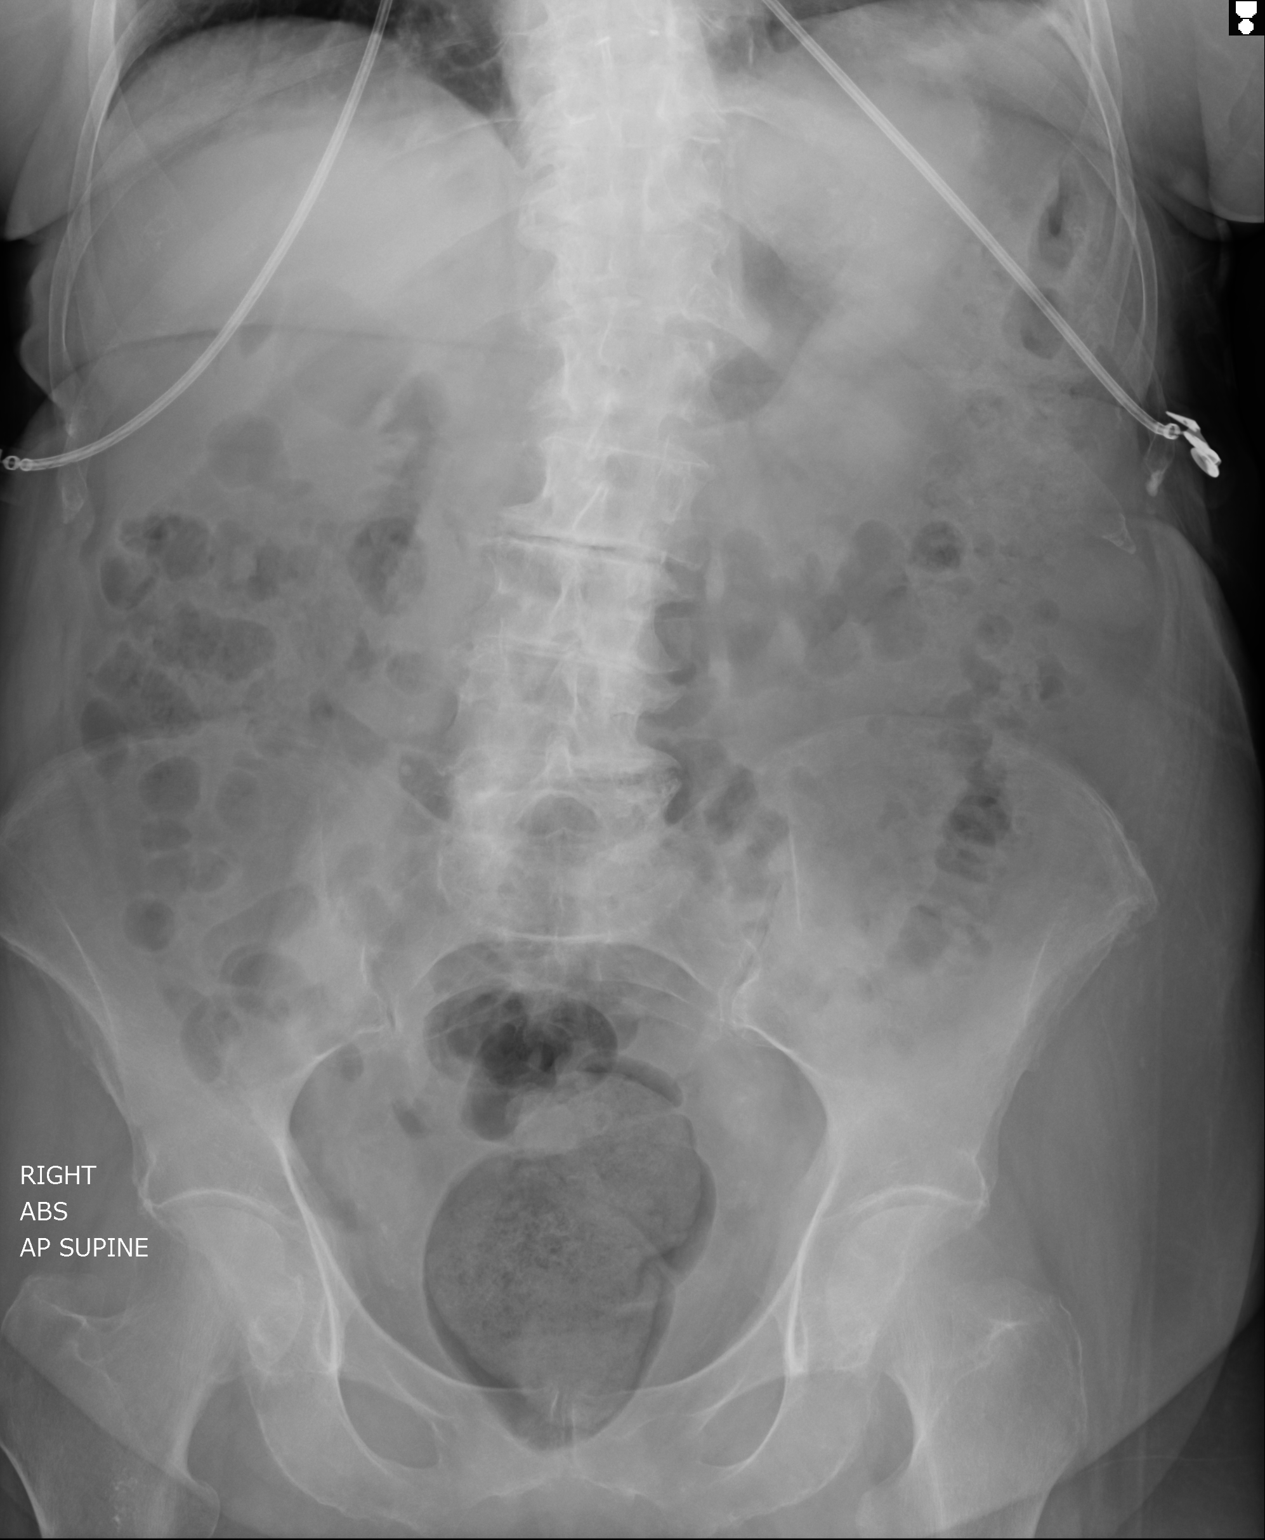

[2 of 2 positions shown; findings below may reference images not displayed]

FINDINGS: Air is seen within nondilated loops of large and small bowel.
Small to moderate amount of stool is appreciated. There is an S-shaped
scoliotic curvature of the lumbar spine.
IMPRESSION: Nonobstructive bowel gas pattern with a small to moderate
amount of stool.

## 2015-05-10 IMAGING — CR DG CHEST 2V
1 series · 3 of 3 positions shown · non-contrast
Comparison: none

REASON FOR EXAM: vomiting
COMMENTS:   May transport without cardiac monitor

[Series 1: ap · 0.17mm/px · 3 of 3 slices shown]
[im 1/3]
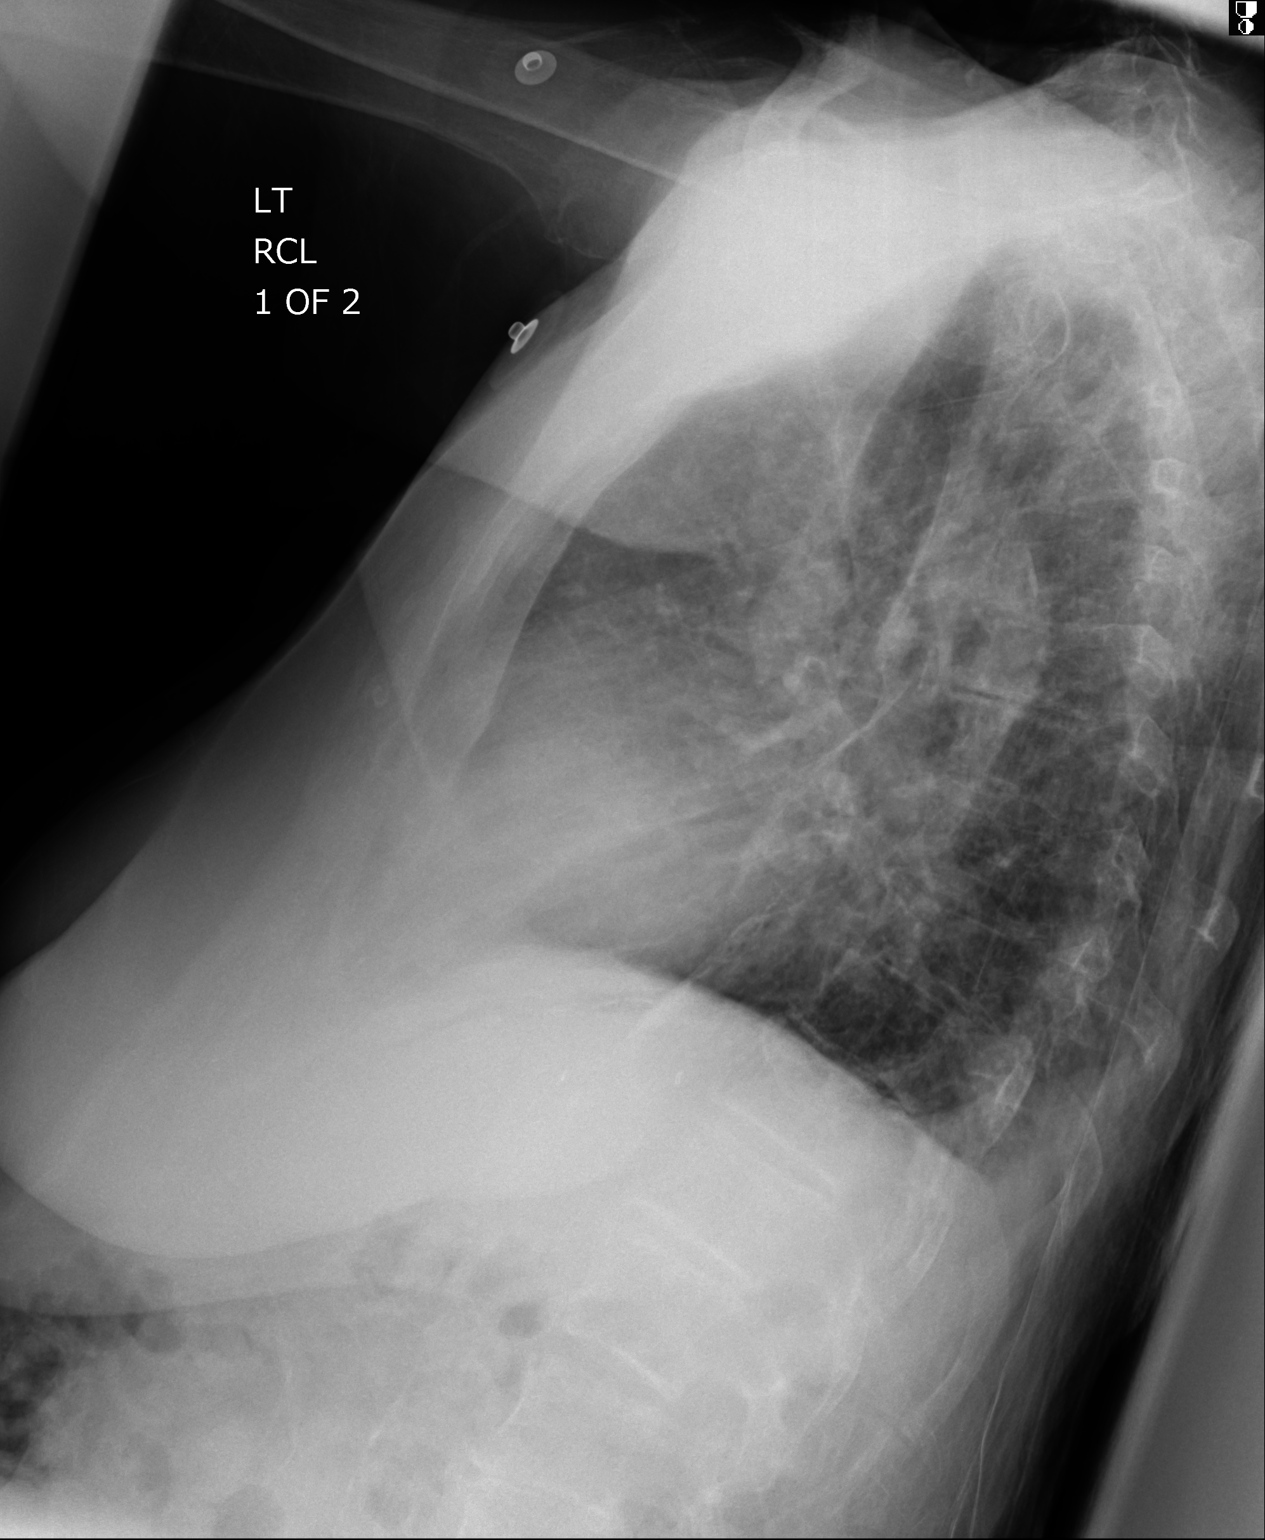
[im 2/3]
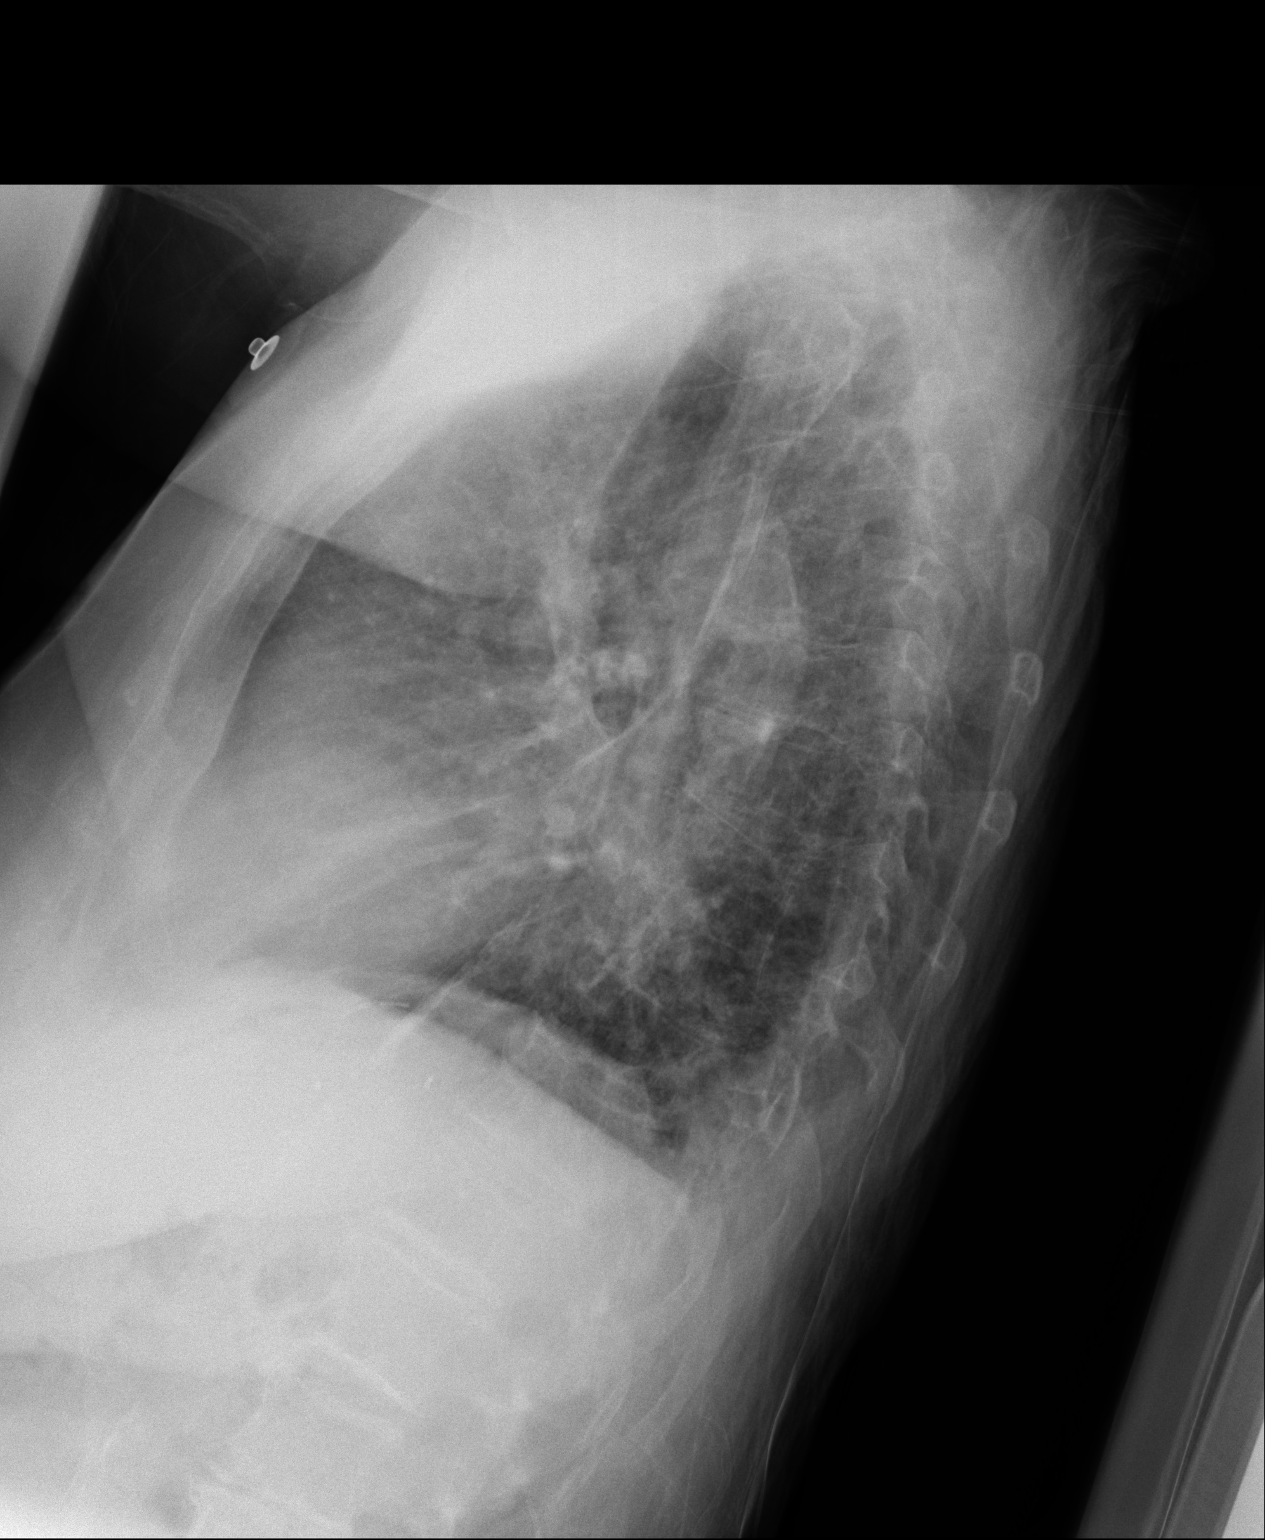
[im 3/3]
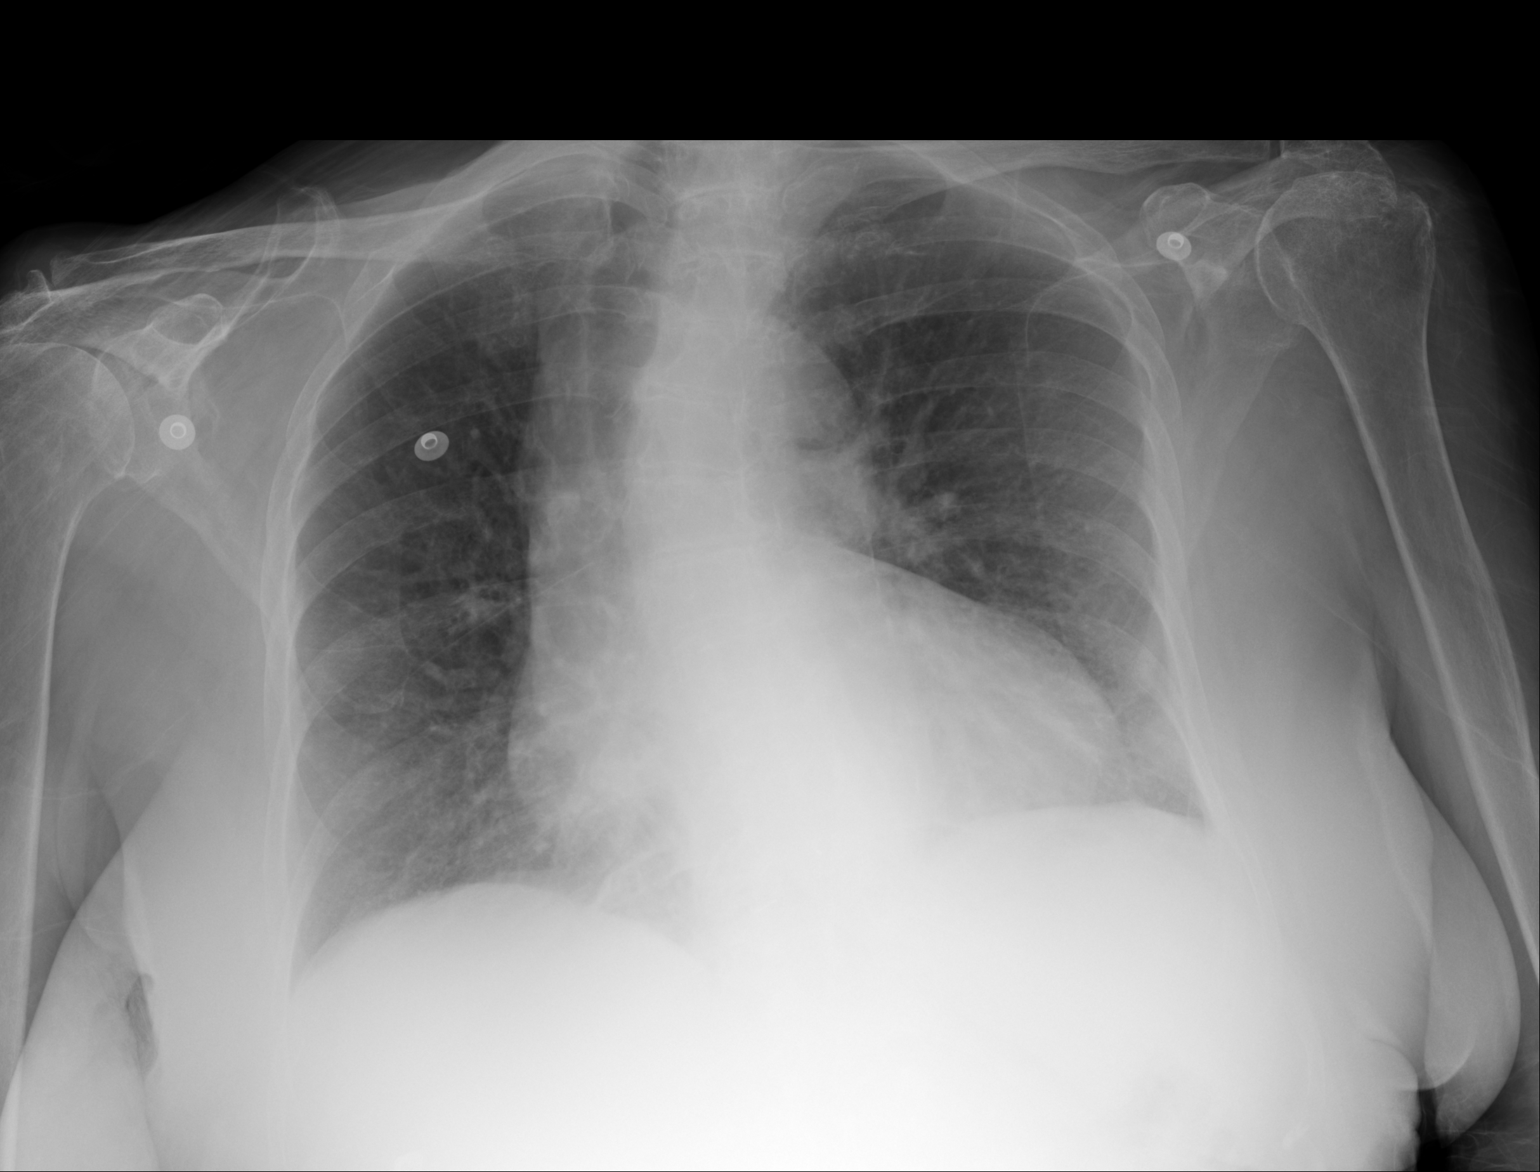

[3 of 3 positions shown; findings below may reference images not displayed]

PROCEDURE:     DXR - DXR CHEST PA (OR AP) AND LATERAL  - April 14, 2013  [DATE]

RESULT:     Comparison is made to the study March 27, 2012.

The lungs are adequately inflated. There is no focal infiltrate. The cardiac
silhouette is enlarged. There is minimal blunting of the posterior
costophrenic angles. There is no alveolar infiltrate. There is no pulmonary
vascular congestion. The bony thorax is grossly normal.
IMPRESSION: There is mild chronic enlargement of the cardiac
silhouette. There is no overt evidence of CHF nor pneumonia.

[REDACTED]

## 2015-05-10 IMAGING — CT CT CHEST W/O CM
1 series · 16 of 32 positions shown, 20 images · non-contrast
Comparison: none

REASON FOR EXAM: Pneumonia on CT abdomen.
COMMENTS:

PROCEDURE:     CT  - CT CHEST WITHOUT CONTRAST  - April 14, 2013  [DATE]
RESULT:
TECHNIQUE: Helical noncontrast 3 mm sections were obtained from the
thoracic inlet through the lung bases.

[Series 2: soft tissue · axial · 0.66mm/px · z∈[-294,-42]mm · 16 of 95 slices shown, 20 images]
[im 7/95  soft-tissue]
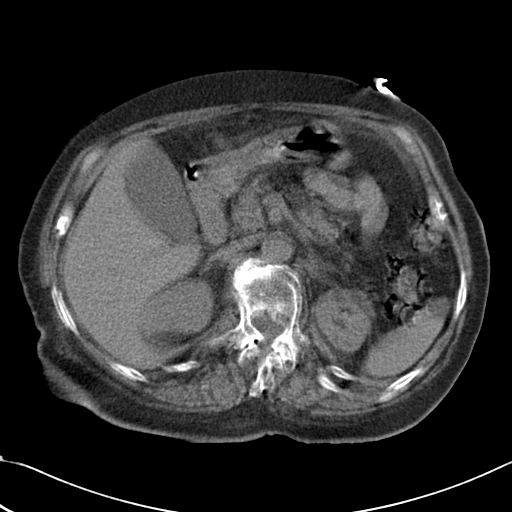
[im 7/95  bone]
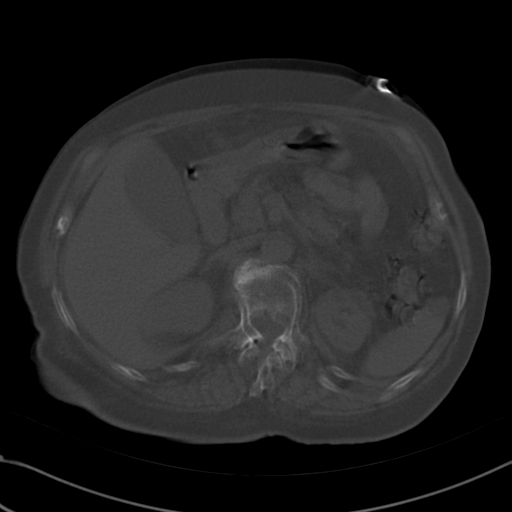
[im 13/95  soft-tissue]
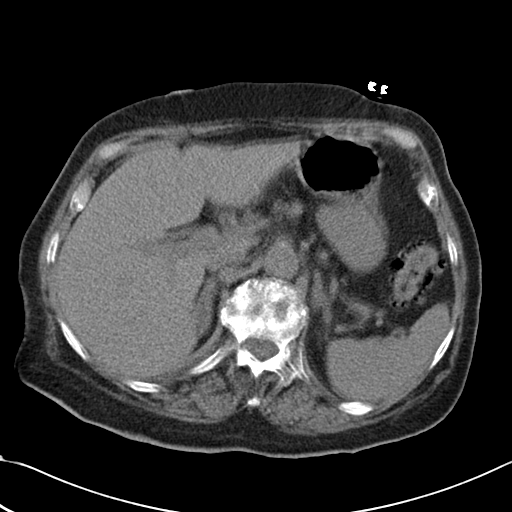
[im 19/95  soft-tissue]
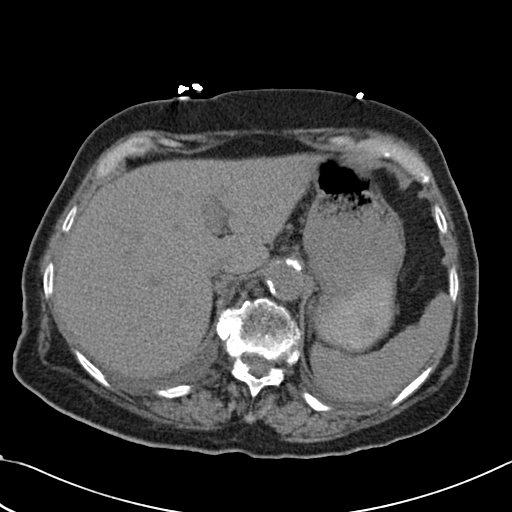
[im 25/95  soft-tissue]
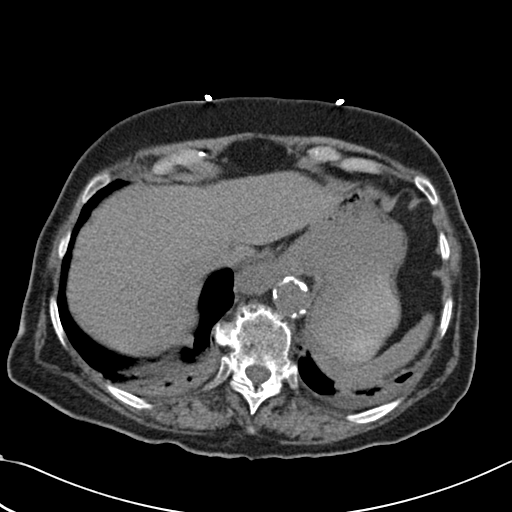
[im 31/95  soft-tissue]
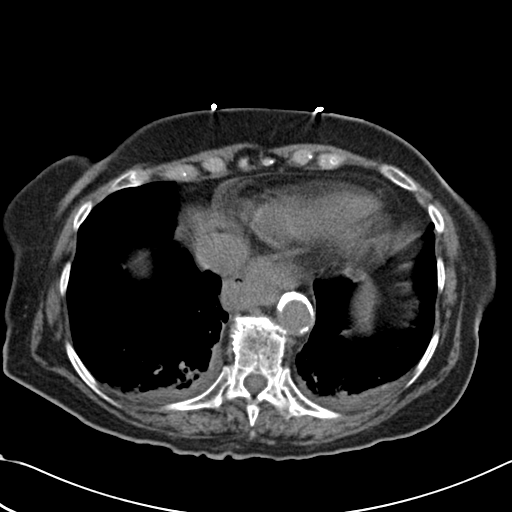
[im 37/95  soft-tissue]
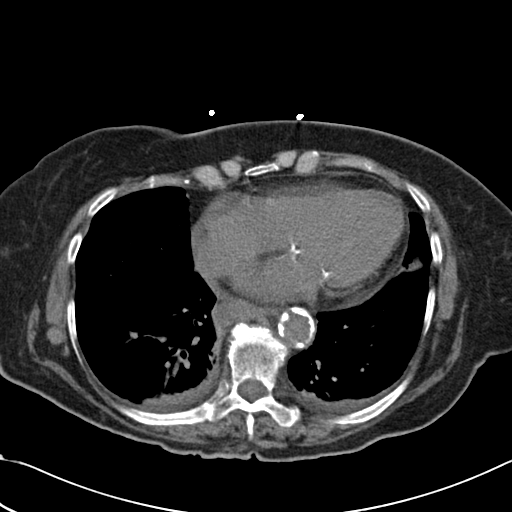
[im 43/95  soft-tissue]
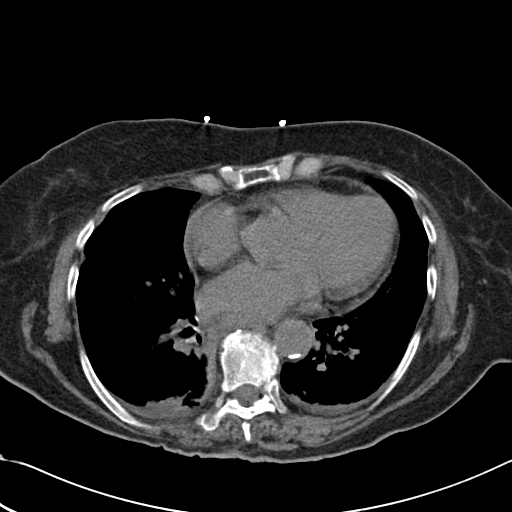
[im 52/95  soft-tissue]
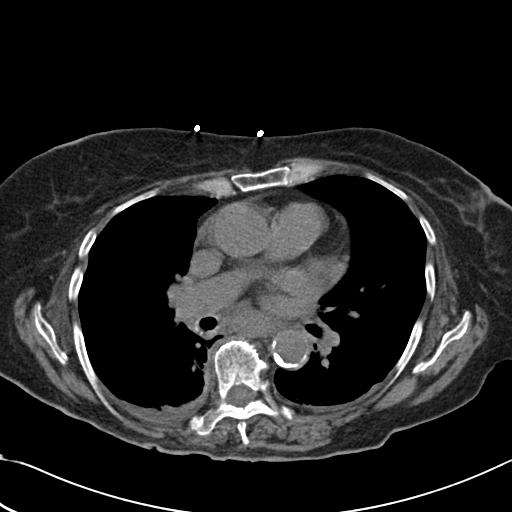
[im 58/95  soft-tissue]
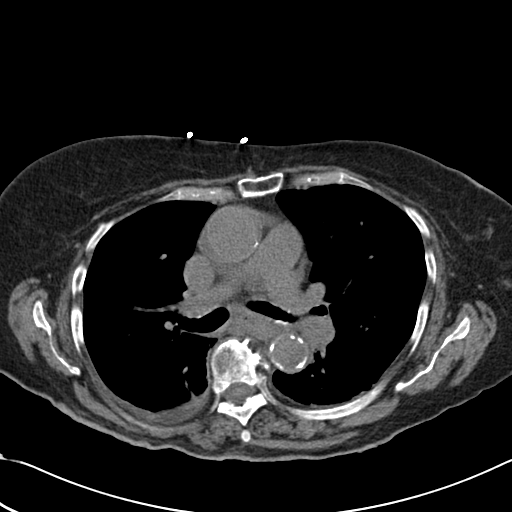
[im 58/95  bone]
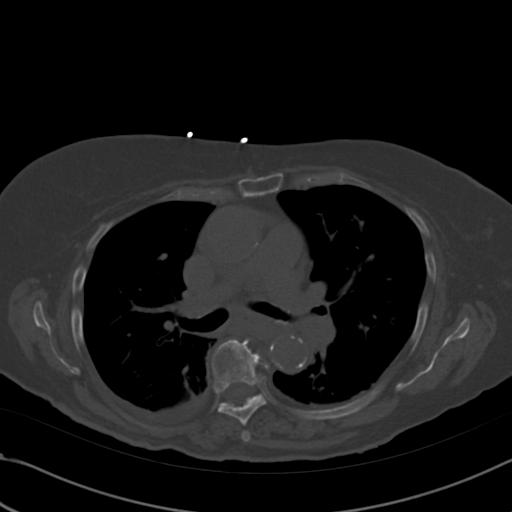
[im 64/95  soft-tissue]
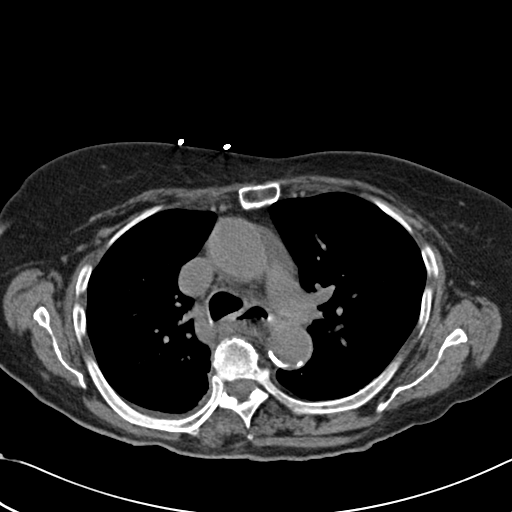
[im 70/95  soft-tissue]
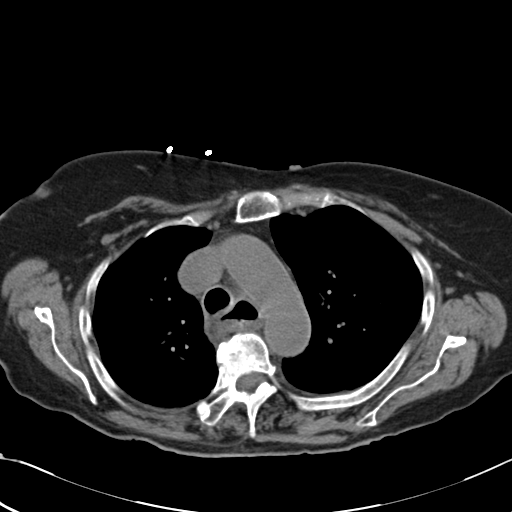
[im 76/95  soft-tissue]
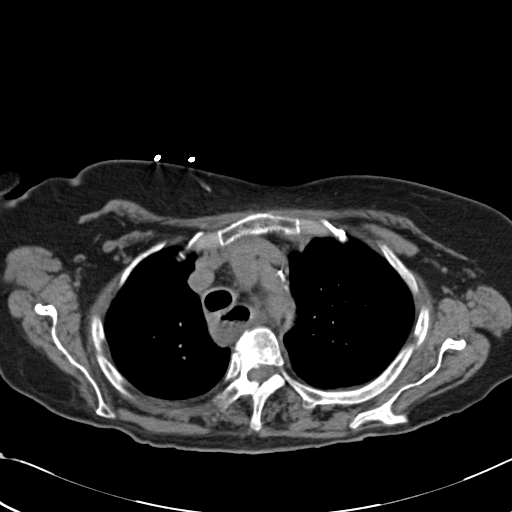
[im 82/95  soft-tissue]
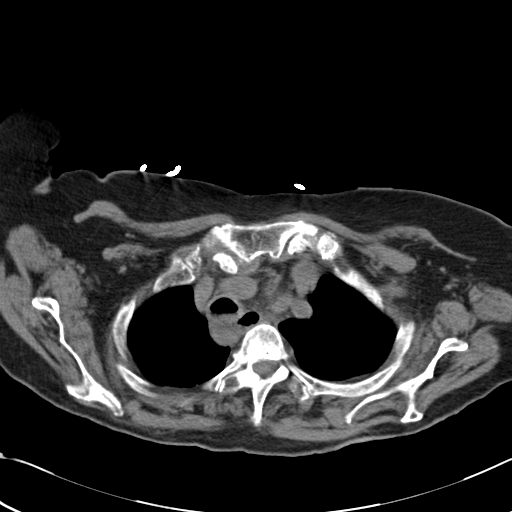
[im 82/95  lung]
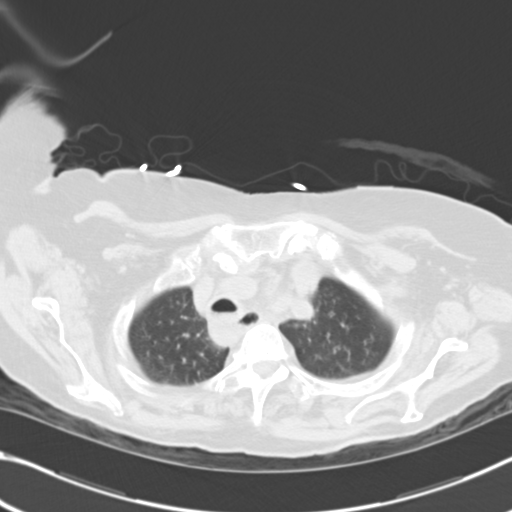
[im 85/95  lung]
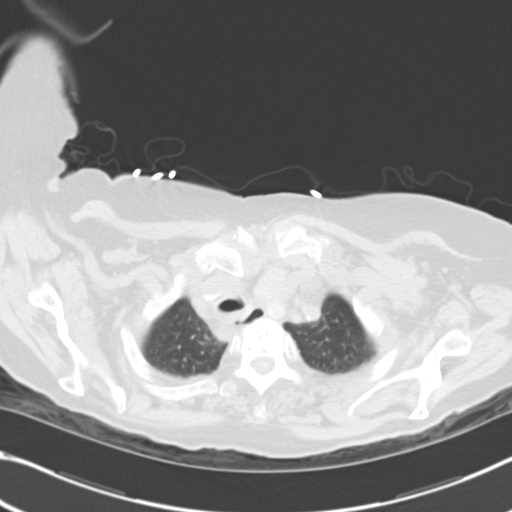
[im 88/95  soft-tissue]
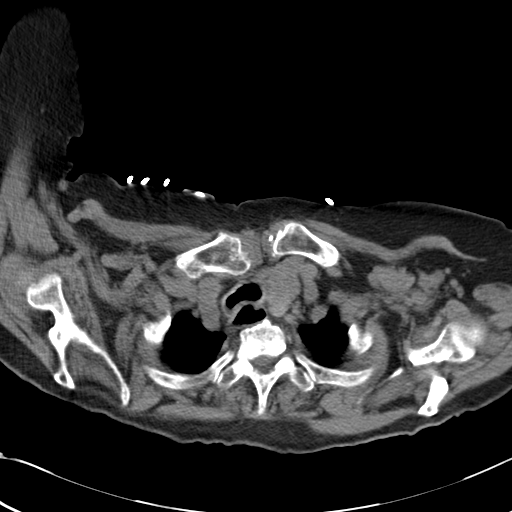
[im 88/95  lung]
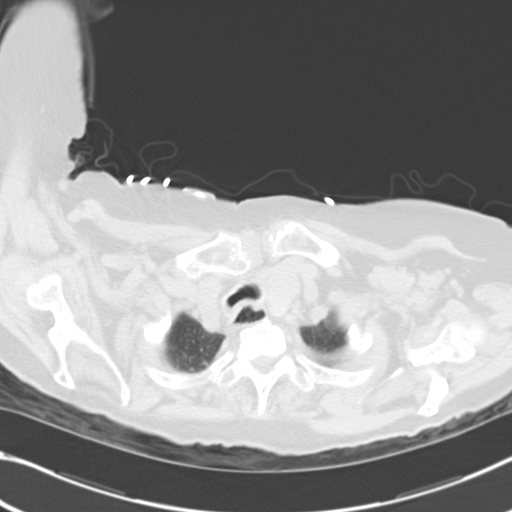
[im 91/95  lung]
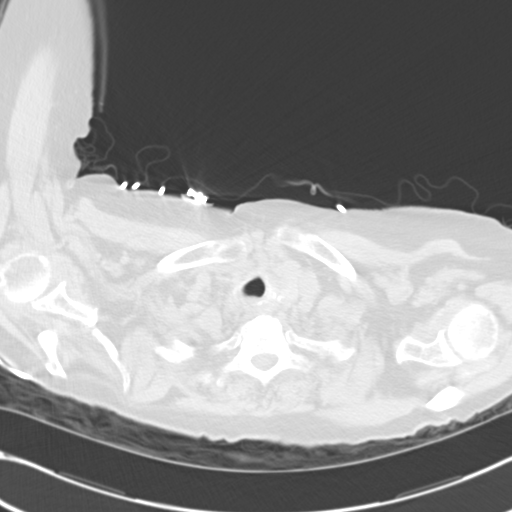

[16 of 32 positions shown; findings below may reference images not displayed]

FINDINGS: The thoracic inlet is grossly unremarkable.

Multichamber cardiac enlargement is identified. There is no evidence of
mediastinal adenopathy or masses. A hiatal hernia is identified. Atelectasis
versus infiltrate is appreciated within the right and left lung bases, right
greater than left. Trace, bilateral effusions are identified. There is
thickening of the interstitial markings and mild diffuse ground-glass
density throughout both lungs. Noncontrast upper abdominal viscera
demonstrate no gross abnormalities. There is no evidence of a thoracic
aortic aneurysm.
IMPRESSION: Atelectasis versus infiltrate within the lung bases as well
as trace effusions. An underlying component of pulmonary edema is also of
diagnostic consideration, if clinically appropriate.
